# Patient Record
Sex: Male | Born: 1950
Health system: Southern US, Community
[De-identification: ages and names within clinical notes are randomized; demographics above are authoritative.]

## PROBLEM LIST (undated history)

## (undated) DIAGNOSIS — R112 Nausea with vomiting, unspecified: Secondary | ICD-10-CM

## (undated) DIAGNOSIS — T8859XA Other complications of anesthesia, initial encounter: Secondary | ICD-10-CM

## (undated) DIAGNOSIS — Z9889 Other specified postprocedural states: Secondary | ICD-10-CM

## (undated) DIAGNOSIS — N400 Enlarged prostate without lower urinary tract symptoms: Secondary | ICD-10-CM

---

## 2017-08-12 DIAGNOSIS — K640 First degree hemorrhoids: Secondary | ICD-10-CM | POA: Diagnosis not present

## 2017-08-12 DIAGNOSIS — D126 Benign neoplasm of colon, unspecified: Secondary | ICD-10-CM | POA: Diagnosis not present

## 2017-08-12 DIAGNOSIS — Z1211 Encounter for screening for malignant neoplasm of colon: Secondary | ICD-10-CM | POA: Diagnosis not present

## 2017-08-14 DIAGNOSIS — D126 Benign neoplasm of colon, unspecified: Secondary | ICD-10-CM | POA: Diagnosis not present

## 2017-08-14 DIAGNOSIS — Z1211 Encounter for screening for malignant neoplasm of colon: Secondary | ICD-10-CM | POA: Diagnosis not present

## 2017-08-16 DIAGNOSIS — N5201 Erectile dysfunction due to arterial insufficiency: Secondary | ICD-10-CM | POA: Diagnosis not present

## 2017-08-16 DIAGNOSIS — R972 Elevated prostate specific antigen [PSA]: Secondary | ICD-10-CM | POA: Diagnosis not present

## 2017-08-16 DIAGNOSIS — R350 Frequency of micturition: Secondary | ICD-10-CM | POA: Diagnosis not present

## 2017-08-16 DIAGNOSIS — N401 Enlarged prostate with lower urinary tract symptoms: Secondary | ICD-10-CM | POA: Diagnosis not present

## 2017-08-26 DIAGNOSIS — L821 Other seborrheic keratosis: Secondary | ICD-10-CM | POA: Diagnosis not present

## 2017-08-26 DIAGNOSIS — D0472 Carcinoma in situ of skin of left lower limb, including hip: Secondary | ICD-10-CM | POA: Diagnosis not present

## 2017-08-26 DIAGNOSIS — Z1283 Encounter for screening for malignant neoplasm of skin: Secondary | ICD-10-CM | POA: Diagnosis not present

## 2017-09-09 DIAGNOSIS — L03012 Cellulitis of left finger: Secondary | ICD-10-CM | POA: Diagnosis not present

## 2018-08-27 DIAGNOSIS — N5201 Erectile dysfunction due to arterial insufficiency: Secondary | ICD-10-CM | POA: Diagnosis not present

## 2018-08-27 DIAGNOSIS — N401 Enlarged prostate with lower urinary tract symptoms: Secondary | ICD-10-CM | POA: Diagnosis not present

## 2018-08-27 DIAGNOSIS — R3915 Urgency of urination: Secondary | ICD-10-CM | POA: Diagnosis not present

## 2018-08-27 DIAGNOSIS — R972 Elevated prostate specific antigen [PSA]: Secondary | ICD-10-CM | POA: Diagnosis not present

## 2019-03-28 ENCOUNTER — Ambulatory Visit: Payer: Self-pay

## 2019-03-29 ENCOUNTER — Ambulatory Visit: Payer: Self-pay

## 2019-04-03 ENCOUNTER — Ambulatory Visit: Payer: Self-pay

## 2019-04-18 ENCOUNTER — Ambulatory Visit: Payer: Self-pay

## 2019-09-07 DIAGNOSIS — R972 Elevated prostate specific antigen [PSA]: Secondary | ICD-10-CM | POA: Diagnosis not present

## 2019-09-14 DIAGNOSIS — R3912 Poor urinary stream: Secondary | ICD-10-CM | POA: Diagnosis not present

## 2019-09-14 DIAGNOSIS — R972 Elevated prostate specific antigen [PSA]: Secondary | ICD-10-CM | POA: Diagnosis not present

## 2019-09-14 DIAGNOSIS — N401 Enlarged prostate with lower urinary tract symptoms: Secondary | ICD-10-CM | POA: Diagnosis not present

## 2019-09-14 DIAGNOSIS — R3915 Urgency of urination: Secondary | ICD-10-CM | POA: Diagnosis not present

## 2019-12-01 DIAGNOSIS — R972 Elevated prostate specific antigen [PSA]: Secondary | ICD-10-CM | POA: Diagnosis not present

## 2019-12-07 DIAGNOSIS — R3912 Poor urinary stream: Secondary | ICD-10-CM | POA: Diagnosis not present

## 2019-12-07 DIAGNOSIS — N5201 Erectile dysfunction due to arterial insufficiency: Secondary | ICD-10-CM | POA: Diagnosis not present

## 2019-12-07 DIAGNOSIS — R351 Nocturia: Secondary | ICD-10-CM | POA: Diagnosis not present

## 2019-12-07 DIAGNOSIS — N401 Enlarged prostate with lower urinary tract symptoms: Secondary | ICD-10-CM | POA: Diagnosis not present

## 2019-12-07 DIAGNOSIS — R972 Elevated prostate specific antigen [PSA]: Secondary | ICD-10-CM | POA: Diagnosis not present

## 2020-04-25 DIAGNOSIS — N401 Enlarged prostate with lower urinary tract symptoms: Secondary | ICD-10-CM | POA: Diagnosis not present

## 2020-04-25 DIAGNOSIS — R1030 Lower abdominal pain, unspecified: Secondary | ICD-10-CM | POA: Diagnosis not present

## 2020-04-25 DIAGNOSIS — D72829 Elevated white blood cell count, unspecified: Secondary | ICD-10-CM | POA: Diagnosis not present

## 2020-04-25 DIAGNOSIS — R112 Nausea with vomiting, unspecified: Secondary | ICD-10-CM | POA: Diagnosis not present

## 2020-04-25 DIAGNOSIS — R339 Retention of urine, unspecified: Secondary | ICD-10-CM | POA: Diagnosis not present

## 2020-04-27 DIAGNOSIS — R338 Other retention of urine: Secondary | ICD-10-CM | POA: Diagnosis not present

## 2020-05-02 DIAGNOSIS — R338 Other retention of urine: Secondary | ICD-10-CM | POA: Diagnosis not present

## 2020-05-03 ENCOUNTER — Other Ambulatory Visit: Payer: Self-pay

## 2020-05-03 ENCOUNTER — Emergency Department (HOSPITAL_COMMUNITY)
Admission: EM | Admit: 2020-05-03 | Discharge: 2020-05-03 | Disposition: A | Discharge status: Home / Self Care | Payer: Medicare Other | Attending: Emergency Medicine | Admitting: Emergency Medicine

## 2020-05-03 ENCOUNTER — Encounter (HOSPITAL_COMMUNITY): Payer: Self-pay

## 2020-05-03 DIAGNOSIS — R339 Retention of urine, unspecified: Secondary | ICD-10-CM

## 2020-05-03 HISTORY — DX: Benign prostatic hyperplasia without lower urinary tract symptoms: N40.0

## 2020-05-03 NOTE — ED Provider Notes (Signed)
[facility] EMERGENCY DEPARTMENT Provider Note   CSN: [id number] Arrival date & time: 05/03/20  9937     History Chief Complaint  Patient presents with  . Urinary Retention    Alexander Salas is a 70 y.o. male.  HPI Patient presents with urinary retention.  Last week and been in [place] and had urine Rosanne Gutting retention and had a Foley catheter placed.  Sees Dr. Jeffie Pollock for enlarged prostate.  Yesterday went to [facility] urology and saw Fritz Pickerel and had the catheter removed.  States he was able to urinate.  Last urinated around 2 in the morning but only little amount came out.  Now feels that he really has to go and cannot urinate.  Feels like previous retention.  No fevers.  No chills.  Lab work done recently was reassuring.    Past Medical History:  Diagnosis Date  . Enlarged prostate     There are no problems to display for this patient.   History reviewed. No pertinent surgical history.     History reviewed. No pertinent family history.  Social History   Tobacco Use  . Smoking status: Never Smoker  . Smokeless tobacco: Never Used    Home Medications Prior to Admission medications   Not on File    Allergies    Sulfa antibiotics  Review of Systems   Review of Systems  Constitutional: Negative for appetite change and fever.  Respiratory: Negative for shortness of breath.   Cardiovascular: Negative for chest pain.  Gastrointestinal: Positive for abdominal pain.  Genitourinary: Positive for decreased urine volume and difficulty urinating.  Musculoskeletal: Negative for back pain.  Neurological: Negative for weakness.  Psychiatric/Behavioral: Negative for confusion.    Physical Exam Updated Vital Signs BP (!) 152/83 (BP Location: Right Arm)   Pulse 94   Temp 99.9 F (37.7 C) (Oral)   Resp 19   Ht 5\' 11"  (1.803 m)   Wt 79.4 kg   SpO2 100%   BMI 24.41 kg/m   Physical Exam Vitals and nursing note reviewed.  HENT:     Head: Atraumatic.   Eyes:     Pupils: Pupils are equal, round, and reactive to light.  Cardiovascular:     Rate and Rhythm: Regular rhythm.  Abdominal:     Tenderness: There is abdominal tenderness.     Comments: Suprapubic mass and tenderness, likely bladder.  Genitourinary:    Comments: Suprapubic mass. Skin:    General: Skin is warm.     Capillary Refill: Capillary refill takes less than 2 seconds.  Neurological:     Mental Status: He is alert and oriented to person, place, and time.  Psychiatric:        Mood and Affect: Mood normal.     ED Results / Procedures / Treatments   Labs (all labs ordered are listed, but only abnormal results are displayed) Labs Reviewed - No data to display  EKG None  Radiology No results found.  Procedures Procedures   Medications Ordered in ED Medications - No data to display  ED Course  I have reviewed the triage vital signs and the nursing notes.  Pertinent labs & imaging results that were available during my care of the patient were reviewed by me and considered in my medical decision making (see chart for details).    MDM Rules/Calculators/A&P                          Patient  with urinary retention.  History of same.  Had Foley catheter until yesterday.  Feels much better after placement today with large amount of urine return.  Do not think we need further imaging or blood work.  Discharge home with urology follow-up Final Clinical Impression(s) / ED Diagnoses Final diagnoses:  Urinary retention    Rx / DC Orders ED Discharge Orders    None       Davonna Belling, MD 05/03/20 1607

## 2020-05-03 NOTE — ED Triage Notes (Signed)
Pt reports he is here today due to urinary retention. Pt reports he has h/o enlarged prostate. Pt reports he had foley removed yesterday. Since 2am this morning patient has had difficulty time urinating.

## 2020-05-03 NOTE — ED Notes (Signed)
Pt verbalizes understanding of discharge instructions. Opportunity for questions and answers were provided. Pt discharged from the ED.   ?

## 2020-05-03 NOTE — ED Notes (Signed)
Patient denies pain and is resting comfortably.  

## 2020-05-11 DIAGNOSIS — Z87440 Personal history of urinary (tract) infections: Secondary | ICD-10-CM | POA: Diagnosis not present

## 2020-05-11 DIAGNOSIS — R972 Elevated prostate specific antigen [PSA]: Secondary | ICD-10-CM | POA: Diagnosis not present

## 2020-05-11 DIAGNOSIS — N401 Enlarged prostate with lower urinary tract symptoms: Secondary | ICD-10-CM | POA: Diagnosis not present

## 2020-05-11 DIAGNOSIS — R339 Retention of urine, unspecified: Secondary | ICD-10-CM | POA: Diagnosis not present

## 2020-05-17 DIAGNOSIS — N401 Enlarged prostate with lower urinary tract symptoms: Secondary | ICD-10-CM | POA: Diagnosis not present

## 2020-05-17 DIAGNOSIS — R338 Other retention of urine: Secondary | ICD-10-CM | POA: Diagnosis not present

## 2020-05-28 DIAGNOSIS — R972 Elevated prostate specific antigen [PSA]: Secondary | ICD-10-CM | POA: Diagnosis not present

## 2020-05-30 DIAGNOSIS — R972 Elevated prostate specific antigen [PSA]: Secondary | ICD-10-CM | POA: Diagnosis not present

## 2020-06-01 DIAGNOSIS — R972 Elevated prostate specific antigen [PSA]: Secondary | ICD-10-CM | POA: Diagnosis not present

## 2020-06-01 DIAGNOSIS — N401 Enlarged prostate with lower urinary tract symptoms: Secondary | ICD-10-CM | POA: Diagnosis not present

## 2020-06-01 DIAGNOSIS — R339 Retention of urine, unspecified: Secondary | ICD-10-CM | POA: Diagnosis not present

## 2020-06-07 ENCOUNTER — Telehealth: Payer: Self-pay | Admitting: Urology

## 2020-06-07 NOTE — Telephone Encounter (Signed)
-----   Message from Billey Co, MD sent at 06/07/2020  7:55 AM EDT ----- Regarding: HoLEP patient This is a patient referred from [facility] with retention and a foley to discuss HoLEP, please add him to my schedule this week if he is able to make it(Wednesday has some openings), thanks.   Nickolas Madrid, MD 06/07/2020

## 2020-06-07 NOTE — Telephone Encounter (Signed)
Pt appt made tomorrow Wednesday 4/13 at 1:45

## 2020-06-08 ENCOUNTER — Other Ambulatory Visit: Payer: Self-pay

## 2020-06-08 ENCOUNTER — Telehealth: Payer: Self-pay

## 2020-06-08 ENCOUNTER — Encounter: Payer: Self-pay | Admitting: Urology

## 2020-06-08 ENCOUNTER — Ambulatory Visit (INDEPENDENT_AMBULATORY_CARE_PROVIDER_SITE_OTHER): Payer: Medicare Other | Admitting: Urology

## 2020-06-08 VITALS — BP 169/74 | HR 73 | Ht 71.0 in | Wt 175.0 lb

## 2020-06-08 DIAGNOSIS — N401 Enlarged prostate with lower urinary tract symptoms: Secondary | ICD-10-CM | POA: Diagnosis not present

## 2020-06-08 DIAGNOSIS — N138 Other obstructive and reflux uropathy: Secondary | ICD-10-CM | POA: Diagnosis not present

## 2020-06-08 DIAGNOSIS — Z01818 Encounter for other preprocedural examination: Secondary | ICD-10-CM

## 2020-06-08 DIAGNOSIS — N400 Enlarged prostate without lower urinary tract symptoms: Secondary | ICD-10-CM | POA: Diagnosis not present

## 2020-06-08 DIAGNOSIS — R339 Retention of urine, unspecified: Secondary | ICD-10-CM

## 2020-06-08 NOTE — Progress Notes (Signed)
06/08/20 2:08 PM   Angelito Fanny Dance [birth year] [id number]  CC: BPH and urinary retention  HPI: I saw Mr. Cullers in urology clinic today for BPH and urinary retention.  He is a 70 year old healthy male who is followed by Dr. Jeffie Pollock in [place] for [place] and retention.  He first developed urinary retention on February 27 when he was at a conference in [place] and a Foley was placed.  He had over a liter in his bladder at that time.  He initially passed a voiding trial a week or 2 later in clinic in [place], but later that night developed recurrent retention and and return back to the ED and a Foley was placed for bladder scan over a liter.  He has been on Flomax long-term for long history of weak stream and feeling of incomplete emptying.  He also underwent urodynamics recently in [place] that showed a max bladder pressure of 64 cm of water with inability to void PVR greater than 500 mL, and the Foley catheter was replaced.  TRUS showed a prostate volume of 137 g.  He has not undergone cystoscopy.  He denies a family history of prostate cancer, but his brother has a history of BPH and TURP.  He has a history of PSA ranging from 7-14 and has had 2 prior negative biopsies, and PSA elevation most recently at time of acute Foley placement and UTI likely representing a false elevation.  Additionally with his massive 137 g prostate, this likely represents benign enlargement of the prostate.  He currently has a Foley in place at this time draining clear yellow urine.  PMH: Past Medical History:  Diagnosis Date  . Enlarged prostate     Social History:  reports that he has never smoked. He has never used smokeless tobacco. He reports current alcohol use. He reports that he does not use drugs.  Physical Exam: BP (!) 169/74 (BP Location: Left Arm, Patient Position: Sitting, Cuff Size: Large)   Pulse 73   Ht 5\' 11"  (1.803 m)   Wt 175 lb (79.4 kg)   BMI 24.41 kg/m    Constitutional:  Alert and  oriented, No acute distress. Cardiovascular: No clubbing, cyanosis, or edema. Respiratory: Normal respiratory effort, no increased work of breathing. GI: Abdomen is soft, nontender, nondistended, no abdominal masses Foley with clear yellow urine  Laboratory Data: Reviewed, see HPI  Pertinent Imaging: None to review  Assessment & Plan:   70 year old healthy male with a long history of elevated PSA negative prostate biopsies, BPH with weak stream on Flomax, and now recurrent urinary retention with failed voiding trials.  Prostate measures 137 g on TRUS, and urodynamics showed functional detrusor with pressure of 64 cm of water.  We discussed options including chronic Foley, addition of finasteride and consideration of a repeat void trial in the future, intermittent catheterization, or an outlet procedure.  With his prostate over 100 g we discussed that HOLEP would be his best surgical option.  We discussed the risks and benefits of HoLEP at length.  The procedure requires general anesthesia and takes 2 to 3 hours, and a holmium laser is used to enucleate the prostate and push this tissue into the bladder.  A morcellator is then used to remove this tissue, which is sent for pathology.  The vast majority of patients are able to discharge the same day with a catheter in place for 2 to 3 days, and will follow-up in clinic for a voiding trial.  Approximately 5% of  patients will be admitted overnight to monitor the urine, or if they have multiple co-morbidities.  We specifically discussed the risks of bleeding, infection, retrograde ejaculation, temporary urgency and urge incontinence, very low risk of long-term incontinence, pathologic evaluation of prostate tissue and possible detection of prostate cancer or other malignancy, and possible need for additional procedures.  Urinalysis and culture today, call with results Schedule HOLEP next week   Nickolas Madrid, MD 06/08/2020  [facility] [address] [place], [place] [address] [phone]

## 2020-06-08 NOTE — Telephone Encounter (Signed)
Patient called and left a VM on the triage line wanting to know if he should stop taking his finasteride with his upcoming surgery, please advise?

## 2020-06-08 NOTE — H&P (View-Only) (Signed)
06/08/20 2:08 PM   Alexander Salas [birth year] [id number]  CC: BPH and urinary retention  HPI: I saw Alexander Salas in urology clinic today for BPH and urinary retention.  He is a 70 year old healthy male who is followed by Dr. Jeffie Salas in [place] for [place] and retention.  He first developed urinary retention on February 27 when he was at a conference in [place] and a Foley was placed.  He had over a liter in his bladder at that time.  He initially passed a voiding trial a week or 2 later in clinic in [place], but later that night developed recurrent retention and and return back to the ED and a Foley was placed for bladder scan over a liter.  He has been on Flomax long-term for long history of weak stream and feeling of incomplete emptying.  He also underwent urodynamics recently in [place] that showed a max bladder pressure of 64 cm of water with inability to void PVR greater than 500 mL, and the Foley catheter was replaced.  TRUS showed a prostate volume of 137 g.  He has not undergone cystoscopy.  He denies a family history of prostate cancer, but his brother has a history of BPH and TURP.  He has a history of PSA ranging from 7-14 and has had 2 prior negative biopsies, and PSA elevation most recently at time of acute Foley placement and UTI likely representing a false elevation.  Additionally with his massive 137 g prostate, this likely represents benign enlargement of the prostate.  He currently has a Foley in place at this time draining clear yellow urine.  PMH: Past Medical History:  Diagnosis Date  . Enlarged prostate     Social History:  reports that he has never smoked. He has never used smokeless tobacco. He reports current alcohol use. He reports that he does not use drugs.  Physical Exam: BP (!) 169/74 (BP Location: Left Arm, Patient Position: Sitting, Cuff Size: Large)   Pulse 73   Ht 5\' 11"  (1.803 m)   Wt 175 lb (79.4 kg)   BMI 24.41 kg/m    Constitutional:  Alert and  oriented, No acute distress. Cardiovascular: No clubbing, cyanosis, or edema. Respiratory: Normal respiratory effort, no increased work of breathing. GI: Abdomen is soft, nontender, nondistended, no abdominal masses Foley with clear yellow urine  Laboratory Data: Reviewed, see HPI  Pertinent Imaging: None to review  Assessment & Plan:   70 year old healthy male with a long history of elevated PSA negative prostate biopsies, BPH with weak stream on Flomax, and now recurrent urinary retention with failed voiding trials.  Prostate measures 137 g on TRUS, and urodynamics showed functional detrusor with pressure of 64 cm of water.  We discussed options including chronic Foley, addition of finasteride and consideration of a repeat void trial in the future, intermittent catheterization, or an outlet procedure.  With his prostate over 100 g we discussed that HOLEP would be his best surgical option.  We discussed the risks and benefits of HoLEP at length.  The procedure requires general anesthesia and takes 2 to 3 hours, and a holmium laser is used to enucleate the prostate and push this tissue into the bladder.  A morcellator is then used to remove this tissue, which is sent for pathology.  The vast majority of patients are able to discharge the same day with a catheter in place for 2 to 3 days, and will follow-up in clinic for a voiding trial.  Approximately 5% of  patients will be admitted overnight to monitor the urine, or if they have multiple co-morbidities.  We specifically discussed the risks of bleeding, infection, retrograde ejaculation, temporary urgency and urge incontinence, very low risk of long-term incontinence, pathologic evaluation of prostate tissue and possible detection of prostate cancer or other malignancy, and possible need for additional procedures.  Urinalysis and culture today, call with results Schedule HOLEP next week   Alexander Madrid, MD 06/08/2020  [facility] [address], [place], [place] [address] [phone]

## 2020-06-08 NOTE — Telephone Encounter (Signed)
Yes, stop finasteride

## 2020-06-08 NOTE — Telephone Encounter (Signed)
Called pt informed him of the information below. Pt gave verbal understanding. Medication removed from med list.

## 2020-06-08 NOTE — Patient Instructions (Signed)

## 2020-06-09 ENCOUNTER — Other Ambulatory Visit: Payer: Self-pay

## 2020-06-09 ENCOUNTER — Other Ambulatory Visit: Payer: Self-pay | Admitting: Urology

## 2020-06-09 ENCOUNTER — Encounter
Admission: RE | Admit: 2020-06-09 | Discharge: 2020-06-09 | Disposition: A | Discharge status: Home / Self Care | Payer: Medicare Other | Source: Ambulatory Visit | Attending: Urology | Admitting: Urology

## 2020-06-09 DIAGNOSIS — N138 Other obstructive and reflux uropathy: Secondary | ICD-10-CM

## 2020-06-09 DIAGNOSIS — N401 Enlarged prostate with lower urinary tract symptoms: Secondary | ICD-10-CM

## 2020-06-09 HISTORY — DX: Other complications of anesthesia, initial encounter: T88.59XA

## 2020-06-09 HISTORY — DX: Nausea with vomiting, unspecified: R11.2

## 2020-06-09 HISTORY — DX: Other specified postprocedural states: Z98.890

## 2020-06-09 NOTE — Patient Instructions (Addendum)
Your procedure is scheduled on:06-17-20 FRIDAY Report to the Registration Desk on the 1st floor of the [facility]. To find out your arrival time, please call [phone] between 1PM - 3PM on:06-16-20 THURSDAY  REMEMBER: Instructions that are not followed completely may result in serious medical risk, up to and including death; or upon the discretion of your surgeon and anesthesiologist your surgery may need to be rescheduled.  Do not eat food or drink liquids after midnight the night before surgery.  No gum chewing, lozengers or hard candies.  DO NOT TAKE ANY MEDICATION THE MORNING OF SURGERY   One week prior to surgery: Stop Anti-inflammatories (NSAIDS) such as Advil, Aleve, Ibuprofen, Motrin, Naproxen, Naprosyn and Aspirin based products such as Excedrin, Goodys Powder, BC Powder-OK TO TAKE TYLENOL IF NEEDED  Stop ANY OVER THE COUNTER supplements until after surgery-STOP YOUR FISH OIL-D3 NOW-YOU MAY CONTINUE YOUR MULTIVITAMIN UP UNTIL THE DAY PRIOR TO SURGERY  No Alcohol for 24 hours before or after surgery.  No Smoking including e-cigarettes for 24 hours prior to surgery.  No chewable tobacco products for at least 6 hours prior to surgery.  No nicotine patches on the day of surgery.  Do not use any "recreational" drugs for at least a week prior to your surgery.  Please be advised that the combination of cocaine and anesthesia may have negative outcomes, up to and including death. If you test positive for cocaine, your surgery will be cancelled.  On the morning of surgery brush your teeth with toothpaste and water, you may rinse your mouth with mouthwash if you wish. Do not swallow any toothpaste or mouthwash.  Do not wear jewelry, make-up, hairpins, clips or nail polish.  Do not wear lotions, powders, or perfumes.   Do not shave body from the neck down 48 hours prior to surgery just in case you cut yourself which could leave a site for infection.  Also, freshly shaved skin  may become irritated if using the CHG soap.  Contact lenses, hearing aids and dentures may not be worn into surgery.  Do not bring valuables to the hospital. [facility] is not responsible for any missing/lost belongings or valuables.   Notify your doctor if there is any change in your medical condition (cold, fever, infection).  Wear comfortable clothing (specific to your surgery type) to the hospital.  Plan for stool softeners for home use; pain medications have a tendency to cause constipation. You can also help prevent constipation by eating foods high in fiber such as fruits and vegetables and drinking plenty of fluids as your diet allows.  After surgery, you can help prevent lung complications by doing breathing exercises.  Take deep breaths and cough every 1-2 hours. Your doctor may order a device called an Incentive Spirometer to help you take deep breaths. When coughing or sneezing, hold a pillow firmly against your incision with both hands. This is called "splinting." Doing this helps protect your incision. It also decreases belly discomfort.  If you are being admitted to the hospital overnight, leave your suitcase in the car. After surgery it may be brought to your room.  If you are being discharged the day of surgery, you will not be allowed to drive home. You will need a responsible adult (18 years or older) to drive you home and stay with you that night.   If you are taking public transportation, you will need to have a responsible adult (18 years or older) with you. Please confirm with  your physician that it is acceptable to use public transportation.   Please call the [facility] at [phone] if you have any questions about these instructions.  Surgery Visitation Policy:  Patients undergoing a surgery or procedure may have one family member or support person with them as long as that person is not COVID-19 positive or experiencing its symptoms.   That person may remain in the waiting area during the procedure.  Inpatient Visitation:    Visiting hours are 7 a.m. to 8 p.m. Inpatients will be allowed two visitors daily. The visitors may change each day during the patient's stay. No visitors under the age of 67. Any visitor under the age of 56 must be accompanied by an adult. The visitor must pass COVID-19 screenings, use hand sanitizer when entering and exiting the patient's room and wear a mask at all times, including in the patient's room. Patients must also wear a mask when staff or their visitor are in the room. Masking is required regardless of vaccination status.

## 2020-06-10 ENCOUNTER — Encounter
Admission: RE | Admit: 2020-06-10 | Discharge: 2020-06-10 | Disposition: A | Discharge status: Home / Self Care | Payer: Medicare Other | Source: Ambulatory Visit | Attending: Urology | Admitting: Urology

## 2020-06-10 DIAGNOSIS — R54 Age-related physical debility: Secondary | ICD-10-CM | POA: Insufficient documentation

## 2020-06-10 DIAGNOSIS — Z01810 Encounter for preprocedural cardiovascular examination: Secondary | ICD-10-CM | POA: Diagnosis not present

## 2020-06-13 ENCOUNTER — Telehealth: Payer: Self-pay

## 2020-06-13 NOTE — Telephone Encounter (Signed)
Record release faxed to [facility] on 06/08/20 w/ fax confirmation received. Record release scanned to media.

## 2020-06-15 ENCOUNTER — Other Ambulatory Visit
Admission: RE | Admit: 2020-06-15 | Discharge: 2020-06-15 | Disposition: A | Discharge status: Home / Self Care | Payer: Medicare Other | Source: Ambulatory Visit | Attending: Urology | Admitting: Urology

## 2020-06-15 ENCOUNTER — Telehealth: Payer: Self-pay

## 2020-06-15 DIAGNOSIS — A498 Other bacterial infections of unspecified site: Secondary | ICD-10-CM

## 2020-06-15 MED ORDER — CIPROFLOXACIN HCL 500 MG PO TABS: 500.0000 mg | ORAL_TABLET | Freq: Two times a day (BID) | ORAL | 0 refills | Status: DC

## 2020-06-15 NOTE — Telephone Encounter (Signed)
Called pt informed him of the information below. Pt gave verbal understanding. RX sent. Pt voiced understanding.

## 2020-06-15 NOTE — Progress Notes (Signed)
Arrived at covid testing site; had vaccine card in hand (3 doses). Information entered into Epic. Covid test not required prior to surgery.

## 2020-06-15 NOTE — Telephone Encounter (Signed)
-----   Message from Billey Co, MD sent at 06/15/2020  8:29 AM EDT ----- Lets start cipro 500mg  BID x 3 days to sterilize urine before holep this Autumn Messing, MD 06/15/2020

## 2020-06-17 ENCOUNTER — Ambulatory Visit
Admission: RE | Admit: 2020-06-17 | Discharge: 2020-06-17 | Disposition: A | Discharge status: Home / Self Care | Payer: Medicare Other | Attending: Urology | Admitting: Urology

## 2020-06-17 ENCOUNTER — Ambulatory Visit: Payer: Medicare Other | Admitting: Anesthesiology

## 2020-06-17 ENCOUNTER — Ambulatory Visit: Payer: Medicare Other

## 2020-06-17 ENCOUNTER — Encounter: Payer: Self-pay | Admitting: Urology

## 2020-06-17 ENCOUNTER — Encounter
Admission: RE | Disposition: A | Discharge status: Home / Self Care | Payer: Self-pay | Source: Home / Self Care | Attending: Urology

## 2020-06-17 ENCOUNTER — Other Ambulatory Visit: Payer: Self-pay

## 2020-06-17 DIAGNOSIS — N138 Other obstructive and reflux uropathy: Secondary | ICD-10-CM | POA: Insufficient documentation

## 2020-06-17 DIAGNOSIS — R338 Other retention of urine: Secondary | ICD-10-CM | POA: Insufficient documentation

## 2020-06-17 DIAGNOSIS — N401 Enlarged prostate with lower urinary tract symptoms: Secondary | ICD-10-CM | POA: Insufficient documentation

## 2020-06-17 DIAGNOSIS — A498 Other bacterial infections of unspecified site: Secondary | ICD-10-CM

## 2020-06-17 HISTORY — PX: HOLEP-LASER ENUCLEATION OF THE PROSTATE WITH MORCELLATION: SHX6641

## 2020-06-17 LAB — CULTURE, URINE COMPREHENSIVE

## 2020-06-17 SURGERY — ENUCLEATION, PROSTATE, USING LASER, WITH MORCELLATION
Anesthesia: General

## 2020-06-17 MED ORDER — FENTANYL CITRATE (PF) 100 MCG/2ML IJ SOLN
INTRAMUSCULAR | Status: DC | PRN
  Administered 2020-06-17: 25 ug via INTRAVENOUS
  Administered 2020-06-17: 50 ug via INTRAVENOUS
  Administered 2020-06-17: 25 ug via INTRAVENOUS

## 2020-06-17 MED ORDER — PHENYLEPHRINE HCL (PRESSORS) 10 MG/ML IV SOLN
INTRAVENOUS | Status: DC | PRN
  Administered 2020-06-17 (×6): 100 ug via INTRAVENOUS

## 2020-06-17 MED ORDER — ACETAMINOPHEN 10 MG/ML IV SOLN
INTRAVENOUS | Status: AC
  Filled 2020-06-17: qty 100

## 2020-06-17 MED ORDER — ROCURONIUM BROMIDE 100 MG/10ML IV SOLN
INTRAVENOUS | Status: DC | PRN
  Administered 2020-06-17: 30 mg via INTRAVENOUS
  Administered 2020-06-17: 10 mg via INTRAVENOUS
  Administered 2020-06-17: 40 mg via INTRAVENOUS
  Administered 2020-06-17: 10 mg via INTRAVENOUS

## 2020-06-17 MED ORDER — MIDAZOLAM HCL 2 MG/2ML IJ SOLN
INTRAMUSCULAR | Status: AC
  Filled 2020-06-17: qty 2

## 2020-06-17 MED ORDER — DEXAMETHASONE SODIUM PHOSPHATE 10 MG/ML IJ SOLN
INTRAMUSCULAR | Status: DC | PRN
  Administered 2020-06-17: 10 mg via INTRAVENOUS

## 2020-06-17 MED ORDER — CIPROFLOXACIN IN D5W 400 MG/200ML IV SOLN
INTRAVENOUS | Status: AC
  Filled 2020-06-17: qty 200

## 2020-06-17 MED ORDER — FAMOTIDINE 20 MG PO TABS: 20.0000 mg | ORAL_TABLET | Freq: Once | ORAL | Status: AC

## 2020-06-17 MED ORDER — SEVOFLURANE IN SOLN
RESPIRATORY_TRACT | Status: AC
  Filled 2020-06-17: qty 250

## 2020-06-17 MED ORDER — MIDAZOLAM HCL 2 MG/2ML IJ SOLN
INTRAMUSCULAR | Status: DC | PRN
  Administered 2020-06-17: 2 mg via INTRAVENOUS

## 2020-06-17 MED ORDER — SUGAMMADEX SODIUM 200 MG/2ML IV SOLN
INTRAVENOUS | Status: DC | PRN
  Administered 2020-06-17: 200 mg via INTRAVENOUS

## 2020-06-17 MED ORDER — HYDROCODONE-ACETAMINOPHEN 5-325 MG PO TABS: 1.0000 | ORAL_TABLET | Freq: Four times a day (QID) | ORAL | 0 refills | Status: AC | PRN

## 2020-06-17 MED ORDER — ACETAMINOPHEN 10 MG/ML IV SOLN
INTRAVENOUS | Status: DC | PRN
  Administered 2020-06-17: 1000 mg via INTRAVENOUS

## 2020-06-17 MED ORDER — BELLADONNA ALKALOIDS-OPIUM 16.2-60 MG RE SUPP
RECTAL | Status: AC
  Filled 2020-06-17: qty 1

## 2020-06-17 MED ORDER — PROPOFOL 10 MG/ML IV BOLUS
INTRAVENOUS | Status: AC
  Filled 2020-06-17: qty 20

## 2020-06-17 MED ORDER — SUCCINYLCHOLINE CHLORIDE 20 MG/ML IJ SOLN
INTRAMUSCULAR | Status: DC | PRN
  Administered 2020-06-17: 120 mg via INTRAVENOUS

## 2020-06-17 MED ORDER — CEFAZOLIN SODIUM-DEXTROSE 2-4 GM/100ML-% IV SOLN
INTRAVENOUS | Status: AC
  Filled 2020-06-17: qty 100

## 2020-06-17 MED ORDER — LIDOCAINE HCL (CARDIAC) PF 100 MG/5ML IV SOSY
PREFILLED_SYRINGE | INTRAVENOUS | Status: DC | PRN
  Administered 2020-06-17: 100 mg via INTRAVENOUS

## 2020-06-17 MED ORDER — CHLORHEXIDINE GLUCONATE 0.12 % MT SOLN
OROMUCOSAL | Status: AC
  Administered 2020-06-17: 15 mL via OROMUCOSAL
  Filled 2020-06-17: qty 15

## 2020-06-17 MED ORDER — ONDANSETRON HCL 4 MG/2ML IJ SOLN
INTRAMUSCULAR | Status: DC | PRN
  Administered 2020-06-17 (×2): 4 mg via INTRAVENOUS

## 2020-06-17 MED ORDER — CEFAZOLIN SODIUM-DEXTROSE 2-4 GM/100ML-% IV SOLN
2.0000 g | INTRAVENOUS | Status: AC
  Administered 2020-06-17: 2 g via INTRAVENOUS

## 2020-06-17 MED ORDER — PROPOFOL 10 MG/ML IV BOLUS
INTRAVENOUS | Status: DC | PRN
  Administered 2020-06-17: 180 mg via INTRAVENOUS

## 2020-06-17 MED ORDER — FAMOTIDINE 20 MG PO TABS
ORAL_TABLET | ORAL | Status: AC
  Administered 2020-06-17: 20 mg via ORAL
  Filled 2020-06-17: qty 1

## 2020-06-17 MED ORDER — CIPROFLOXACIN HCL 500 MG PO TABS: 500.0000 mg | ORAL_TABLET | Freq: Two times a day (BID) | ORAL | 1 refills | Status: AC

## 2020-06-17 MED ORDER — FENTANYL CITRATE (PF) 100 MCG/2ML IJ SOLN: 25.0000 ug | INTRAMUSCULAR | Status: DC | PRN

## 2020-06-17 MED ORDER — LACTATED RINGERS IV SOLN: INTRAVENOUS | Status: DC

## 2020-06-17 MED ORDER — FENTANYL CITRATE (PF) 100 MCG/2ML IJ SOLN
INTRAMUSCULAR | Status: AC
  Filled 2020-06-17: qty 2

## 2020-06-17 MED ORDER — BELLADONNA ALKALOIDS-OPIUM 16.2-60 MG RE SUPP
RECTAL | Status: DC | PRN
  Administered 2020-06-17: 1 via RECTAL

## 2020-06-17 MED ORDER — EPHEDRINE SULFATE 50 MG/ML IJ SOLN
INTRAMUSCULAR | Status: DC | PRN
  Administered 2020-06-17: 10 mg via INTRAVENOUS

## 2020-06-17 MED ORDER — ONDANSETRON HCL 4 MG/2ML IJ SOLN
4.0000 mg | Freq: Once | INTRAMUSCULAR | Status: DC | PRN
Start: 2020-06-17 — End: 2020-06-17

## 2020-06-17 MED ORDER — CIPROFLOXACIN IN D5W 400 MG/200ML IV SOLN
INTRAVENOUS | Status: DC | PRN
  Administered 2020-06-17: 400 mg via INTRAVENOUS

## 2020-06-17 MED ORDER — ORAL CARE MOUTH RINSE: 15.0000 mL | Freq: Once | OROMUCOSAL | Status: AC

## 2020-06-17 MED ORDER — CHLORHEXIDINE GLUCONATE 0.12 % MT SOLN: 15.0000 mL | Freq: Once | OROMUCOSAL | Status: AC

## 2020-06-17 SURGICAL SUPPLY — 36 items
ADAPTER IRRIG TUBE 2 SPIKE SOL (ADAPTER) ×4 IMPLANT
ADPR TBG 2 SPK PMP STRL ASCP (ADAPTER) ×2
BAG DRN LRG CPC RND TRDRP CNTR (MISCELLANEOUS) ×1
BAG URO DRAIN 4000ML (MISCELLANEOUS) ×2 IMPLANT
CATH FOLEY 3WAY 30CC 24FR (CATHETERS) ×2
CATH URETL 5X70 OPEN END (CATHETERS) ×2 IMPLANT
CATH URTH STD 24FR FL 3W 2 (CATHETERS) ×1 IMPLANT
CONTAINER COLLECT MORCELLATR (MISCELLANEOUS) ×1 IMPLANT
DRAPE UTILITY 15X26 TOWEL STRL (DRAPES) ×2 IMPLANT
ELECT BIVAP BIPO 22/24 DONUT (ELECTROSURGICAL)
ELECTRD BIVAP BIPO 22/24 DONUT (ELECTROSURGICAL) IMPLANT
FIBER LASER MOSES 550 DFL (Laser) ×2 IMPLANT
FILTER OVERFLOW MORCELLATOR (FILTER) ×1 IMPLANT
GLOVE SURG UNDER POLY LF SZ7.5 (GLOVE) ×2 IMPLANT
GOWN STRL REUS W/ TWL LRG LVL3 (GOWN DISPOSABLE) ×1 IMPLANT
GOWN STRL REUS W/ TWL XL LVL3 (GOWN DISPOSABLE) ×1 IMPLANT
GOWN STRL REUS W/TWL LRG LVL3 (GOWN DISPOSABLE) ×2
GOWN STRL REUS W/TWL XL LVL3 (GOWN DISPOSABLE) ×2
HOLDER FOLEY CATH W/STRAP (MISCELLANEOUS) ×2 IMPLANT
IV NS IRRIG 3000ML ARTHROMATIC (IV SOLUTION) ×30 IMPLANT
KIT TURNOVER CYSTO (KITS) ×2 IMPLANT
MANIFOLD NEPTUNE II (INSTRUMENTS) IMPLANT
MBRN O SEALING YLW 17 FOR INST (MISCELLANEOUS) ×2
MEMBRANE SLNG YLW 17 FOR INST (MISCELLANEOUS) ×1 IMPLANT
MORCELLATOR COLLECT CONTAINER (MISCELLANEOUS) ×2
MORCELLATOR OVERFLOW FILTER (FILTER) ×2
MORCELLATOR ROTATION 4.75 335 (MISCELLANEOUS) ×2 IMPLANT
PACK CYSTO AR (MISCELLANEOUS) ×2 IMPLANT
SET CYSTO W/LG BORE CLAMP LF (SET/KITS/TRAYS/PACK) ×2 IMPLANT
SET IRRIG Y TYPE TUR BLADDER L (SET/KITS/TRAYS/PACK) ×2 IMPLANT
SLEEVE PROTECTION STRL DISP (MISCELLANEOUS) ×4 IMPLANT
SURGILUBE 2OZ TUBE FLIPTOP (MISCELLANEOUS) ×2 IMPLANT
SYR TOOMEY IRRIG 70ML (MISCELLANEOUS) ×2
SYRINGE TOOMEY IRRIG 70ML (MISCELLANEOUS) ×1 IMPLANT
TUBE PUMP MORCELLATOR PIRANHA (TUBING) ×2 IMPLANT
WATER STERILE IRR 1000ML POUR (IV SOLUTION) ×2 IMPLANT

## 2020-06-17 NOTE — Anesthesia Postprocedure Evaluation (Signed)
Anesthesia Post Note  Patient: Alexander Salas  Procedure(s) Performed: HOLEP-LASER ENUCLEATION OF THE PROSTATE WITH MORCELLATION (N/A )  Patient location during evaluation: PACU Anesthesia Type: General Level of consciousness: awake and alert and oriented Pain management: pain level controlled Vital Signs Assessment: post-procedure vital signs reviewed and stable Respiratory status: spontaneous breathing, nonlabored ventilation and respiratory function stable Cardiovascular status: blood pressure returned to baseline and stable Postop Assessment: no signs of nausea or vomiting Anesthetic complications: no   No complications documented.   Last Vitals:  Vitals:   06/17/20 1051 06/17/20 1117  BP: 128/68 121/71  Pulse: 69 73  Resp: 18 16  Temp: (!) 36.2 C   SpO2: 99% 99%    Last Pain:  Vitals:   06/17/20 1117  TempSrc:   PainSc: 0-No pain                 Hafsa Lohn

## 2020-06-17 NOTE — OR Nursing (Signed)
Dr. Sninsky in to see pt in postop prior to discharge. 

## 2020-06-17 NOTE — Interval H&P Note (Signed)
UROLOGY H&P UPDATE  Agree with prior H&P dated 06/08/20. BPH and foley dependent urinary retention, prostate measures 137g.  Cardiac: RRR Lungs: CTA bilaterally  Laterality: N/A Procedure: HoLEP  Urine: 4/13 culture pseudomonas, has been on culture appropriate cipro  We discussed the risks and benefits of HoLEP at length.  The procedure requires general anesthesia and takes 2 to 3 hours, and a holmium laser is used to enucleate the prostate and push this tissue into the bladder.  A morcellator is then used to remove this tissue, which is sent for pathology.  The vast majority of patients are able to discharge the same day with a catheter in place for 2 to 3 days, and will follow-up in clinic for a voiding trial.  Approximately 5% of patients will be admitted overnight to monitor the urine, or if they have multiple co-morbidities.  We specifically discussed the risks of bleeding, infection, retrograde ejaculation, temporary urgency and urge incontinence, very low risk of long-term incontinence, pathologic evaluation of prostate tissue and possible detection of prostate cancer or other malignancy, and possible need for additional procedures.   Billey Co, MD 06/17/2020

## 2020-06-17 NOTE — Transfer of Care (Signed)
Immediate Anesthesia Transfer of Care Note  Patient: Alexander Salas  Procedure(s) Performed: HOLEP-LASER ENUCLEATION OF THE PROSTATE WITH MORCELLATION (N/A )  Patient Location: PACU  Anesthesia Type:General  Level of Consciousness: awake and sedated  Airway & Oxygen Therapy: Patient Spontanous Breathing and Patient connected to face mask oxygen  Post-op Assessment: Report given to RN and Post -op Vital signs reviewed and stable  Post vital signs: Reviewed and stable  Last Vitals:  Vitals Value Taken Time  BP    Temp    Pulse 72 06/17/20 0949  Resp 14 06/17/20 0949  SpO2 100 % 06/17/20 0949  Vitals shown include unvalidated device data.  Last Pain:  Vitals:   06/17/20 0354  TempSrc: Temporal  PainSc: 0-No pain      Patients Stated Pain Goal: 0 (65/68/12 7517)  Complications: No complications documented.

## 2020-06-17 NOTE — Anesthesia Preprocedure Evaluation (Addendum)
Anesthesia Evaluation  Patient identified by MRN, date of birth, ID band Patient awake    Reviewed: Allergy & Precautions, NPO status , Patient's Chart, lab work & pertinent test results  History of Anesthesia Complications (+) PONV and history of anesthetic complications  Airway Mallampati: II  TM Distance: >3 FB     Dental  (+) Teeth Intact, Caps   Pulmonary neg pulmonary ROS,    Pulmonary exam normal        Cardiovascular negative cardio ROS Normal cardiovascular exam     Neuro/Psych negative neurological ROS  negative psych ROS   GI/Hepatic negative GI ROS, Neg liver ROS,   Endo/Other  negative endocrine ROS  Renal/GU negative Renal ROS     Musculoskeletal  (+) Arthritis ,   Abdominal Normal abdominal exam  (+)   Peds negative pediatric ROS (+)  Hematology negative hematology ROS (+)   Anesthesia Other Findings Past Medical History: No date: Complication of anesthesia No date: Enlarged prostate No date: PONV (postoperative nausea and vomiting)     Comment:  after 1 hernia surgery  Reproductive/Obstetrics                             Anesthesia Physical Anesthesia Plan  ASA: II  Anesthesia Plan: General   Post-op Pain Management:    Induction: Intravenous  PONV Risk Score and Plan:   Airway Management Planned: Oral ETT  Additional Equipment:   Intra-op Plan:   Post-operative Plan: Extubation in OR  Informed Consent: I have reviewed the patients History and Physical, chart, labs and discussed the procedure including the risks, benefits and alternatives for the proposed anesthesia with the patient or authorized representative who has indicated his/her understanding and acceptance.     Dental advisory given  Plan Discussed with: CRNA and Surgeon  Anesthesia Plan Comments:        Anesthesia Quick Evaluation

## 2020-06-17 NOTE — Discharge Instructions (Addendum)
Indwelling Urinary Catheter Care, Adult An indwelling urinary catheter is a thin tube that is put into your bladder. The tube helps to drain pee (urine) out of your body. The tube goes in through your urethra. Your urethra is where pee comes out of your body. Your pee will come out through the catheter, then it will go into a bag (drainage bag). Take good care of your catheter so it will work well. How to wear your catheter and bag Supplies needed  Sticky tape (adhesive tape) or a leg strap.  Alcohol wipe or soap and water (if you use tape).  A clean towel (if you use tape).  Large overnight bag.  Smaller bag (leg bag). Wearing your catheter Attach your catheter to your leg with tape or a leg strap.  Make sure the catheter is not pulled tight.  If a leg strap gets wet, take it off and put on a dry strap.  If you use tape to hold the bag on your leg: 1. Use an alcohol wipe or soap and water to wash your skin where the tape made it sticky before. 2. Use a clean towel to pat-dry that skin. 3. Use new tape to make the bag stay on your leg. Wearing your bags You should have been given a large overnight bag.  You may wear the overnight bag in the day or night.  Always have the overnight bag lower than your bladder.  Do not let the bag touch the floor.  Before you go to sleep, put a clean plastic bag in a wastebasket. Then hang the overnight bag inside the wastebasket. You should also have a smaller leg bag that fits under your clothes.  Always wear the leg bag below your knee.  Do not wear your leg bag at night. How to care for your skin and catheter Supplies needed  A clean washcloth.  Water and mild soap.  A clean towel. Caring for your skin and catheter  Clean the skin around your catheter every day: 1. Wash your hands with soap and water. 2. Wet a clean washcloth in warm water and mild soap. 3. Clean the skin around your urethra.  If you are male:  Gently  spread the folds of skin around your vagina (labia).  With the washcloth in your other hand, wipe the inner side of your labia on each side. Wipe from front to back.  If you are male:  Pull back any skin that covers the end of your penis (foreskin).  With the washcloth in your other hand, wipe your penis in small circles. Start wiping at the tip of your penis, then move away from the catheter.  Move the foreskin back in place, if needed. 4. With your free hand, hold the catheter close to where it goes into your body.  Keep holding the catheter during cleaning so it does not get pulled out. 5. With the washcloth in your other hand, clean the catheter.  Only wipe downward on the catheter.  Do not wipe upward toward your body. Doing this may push germs into your urethra and cause infection. 6. Use a clean towel to pat-dry the catheter and the skin around it. Make sure to wipe off all soap. 7. Wash your hands with soap and water.  Shower every day. Do not take baths.  Do not use cream, ointment, or lotion on the area where the catheter goes into your body, unless your doctor tells you to.  Do not   use powders, sprays, or lotions on your genital area.  Check your skin around the catheter every day for signs of infection. Check for: ? Redness, swelling, or pain. ? Fluid or blood. ? Warmth. ? Pus or a bad smell.      How to empty the bag Supplies needed  Rubbing alcohol.  Gauze pad or cotton ball.  Tape or a leg strap. Emptying the bag Pour the pee out of your bag when it is ?- full, or at least 2-3 times a day. Do this for your overnight bag and your leg bag. 1. Wash your hands with soap and water. 2. Separate (detach) the bag from your leg. 3. Hold the bag over the toilet or a clean pail. Keep the bag lower than your hips and bladder. This is so the pee (urine) does not go back into the tube. 4. Open the pour spout. It is at the bottom of the bag. 5. Empty the pee into the  toilet or pail. Do not let the pour spout touch any surface. 6. Put rubbing alcohol on a gauze pad or cotton ball. 7. Use the gauze pad or cotton ball to clean the pour spout. 8. Close the pour spout. 9. Attach the bag to your leg with tape or a leg strap. 10. Wash your hands with soap and water. Follow instructions for cleaning the drainage bag:  From the product maker.  As told by your doctor. How to change the bag Supplies needed  Alcohol wipes.  A clean bag.  Tape or a leg strap. Changing the bag Replace your bag when it starts to leak, smell bad, or look dirty. 1. Wash your hands with soap and water. 2. Separate the dirty bag from your leg. 3. Pinch the catheter with your fingers so that pee does not spill out. 4. Separate the catheter tube from the bag tube where these tubes connect (at the connection valve). Do not let the tubes touch any surface. 5. Clean the end of the catheter tube with an alcohol wipe. Use a different alcohol wipe to clean the end of the bag tube. 6. Connect the catheter tube to the tube of the clean bag. 7. Attach the clean bag to your leg with tape or a leg strap. Do not make the bag tight on your leg. 8. Wash your hands with soap and water. General rules  Never pull on your catheter. Never try to take it out. Doing that can hurt you.  Always wash your hands before and after you touch your catheter or bag. Use a mild, fragrance-free soap. If you do not have soap and water, use hand sanitizer.  Always make sure there are no twists or bends (kinks) in the catheter tube.  Always make sure there are no leaks in the catheter or bag.  Drink enough fluid to keep your pee pale yellow.  Do not take baths, swim, or use a hot tub.  If you are male, wipe from front to back after you poop (have a bowel movement).   Contact a doctor if:  Your pee is cloudy.  Your pee smells worse than usual.  Your catheter gets clogged.  Your catheter  leaks.  Your bladder feels full. Get help right away if:  You have redness, swelling, or pain where the catheter goes into your body.  You have fluid, blood, pus, or a bad smell coming from the area where the catheter goes into your body.  Your skin feels   warm where the catheter goes into your body.  You have a fever.  You have pain in your: ? Belly (abdomen). ? Legs. ? Lower back. ? Bladder.  You see blood in the catheter.  Your pee is pink or red.  You feel sick to your stomach (nauseous).  You throw up (vomit).  You have chills.  Your pee is not draining into the bag.  Your catheter gets pulled out. Summary  An indwelling urinary catheter is a thin tube that is placed into the bladder to help drain pee (urine) out of the body.  The catheter is placed into the part of the body that drains pee from the bladder (urethra).  Taking good care of your catheter will keep it working properly and help prevent problems.  Always wash your hands before and after touching your catheter or bag.  Never pull on your catheter or try to take it out. This information is not intended to replace advice given to you by your health care provider. Make sure you discuss any questions you have with your health care provider. Document Revised: 06/06/2018 Document Reviewed: 09/28/2016 Elsevier Patient Education  2021 [employer]   AMBULATORY SURGERY  DISCHARGE INSTRUCTIONS   1) The drugs that you were given will stay in your system until tomorrow so for the next 24 hours you should not:  A) Drive an automobile B) Make any legal decisions C) Drink any alcoholic beverage   2) You may resume regular meals tomorrow.  Today it is better to start with liquids and gradually work up to solid foods.  You may eat anything you prefer, but it is better to start with liquids, then soup and crackers, and gradually work up to solid foods.   3) Please notify your doctor immediately if you  have any unusual bleeding, trouble breathing, redness and pain at the surgery site, drainage, fever, or pain not relieved by medication.    4) Additional Instructions:        Please contact your physician with any problems or Same Day Surgery at [phone], Monday through Friday 6 am to 4 pm, or [facility] Main number at [phone]. 

## 2020-06-17 NOTE — Anesthesia Procedure Notes (Signed)
Procedure Name: Intubation Date/Time: 06/17/2020 7:51 AM Performed by: Nelda Marseille, CRNA Pre-anesthesia Checklist: Patient identified, Patient being monitored, Timeout performed, Emergency Drugs available and Suction available Patient Re-evaluated:Patient Re-evaluated prior to induction Oxygen Delivery Method: Circle system utilized Preoxygenation: Pre-oxygenation with 100% oxygen Induction Type: IV induction Ventilation: Mask ventilation without difficulty Laryngoscope Size: Mac, 3 and McGraph Grade View: Grade I Tube type: Oral Tube size: 7.5 mm Number of attempts: 1 Airway Equipment and Method: Stylet Placement Confirmation: ETT inserted through vocal cords under direct vision,  positive ETCO2 and breath sounds checked- equal and bilateral Secured at: 21 cm Tube secured with: Tape Dental Injury: Teeth and Oropharynx as per pre-operative assessment

## 2020-06-17 NOTE — Progress Notes (Signed)
Weaned off CBI.  Urine is light pink to clear.  Capped inflow per Provider's instructions.  Pt tolerated well.

## 2020-06-17 NOTE — Op Note (Signed)
Date of procedure: 06/17/20  Preoperative diagnosis:  1. BPH and obstruction with urinary retention  Postoperative diagnosis:  1. Same  Procedure: 1. HoLEP (Holmium Laser Enucleation of the Prostate)  Surgeon: Nickolas Madrid, MD  Anesthesia: General  Complications: None  Intraoperative findings:  1.  Large prostate with high bladder neck, significant left lateral lobe hypertrophy 2.  Mild bladder erythema from catheter, no suspicious lesions, ureteral orifices orthotopic bilaterally 3.  Ureteral orifices and verumontanum intact at conclusion of case, good hemostasis  EBL: 10 mL  Specimens: Prostate chips  Enucleation time: 60 minutes  Morcellation time: 23 minutes  Intra-op weight: >100 g  Drains: 24 French three-way, 80 cc in balloon  Indication: Alexander Salas is a 70 y.o. patient with BPH and prostate ~140g on ultrasound and urinary retention who was failed multiple voiding trials.  After reviewing the management options for treatment, they elected to proceed with the above surgical procedure(s). We have discussed the potential benefits and risks of the procedure, side effects of the proposed treatment, the likelihood of the patient achieving the goals of the procedure, and any potential problems that might occur during the procedure or recuperation.  We specifically discussed the risks of bleeding, infection, hematuria and clot retention, need for additional procedures, possible overnight hospital stay, temporary urgency and incontinence, rare long-term incontinence, and retrograde ejaculation.  Informed consent has been obtained.   Description of procedure:  The patient was taken to the operating room and general anesthesia was induced.  The patient was placed in the dorsal lithotomy position, prepped and draped in the usual sterile fashion, and preoperative antibiotics(Cipro and Ancef) were administered.  SCDs were placed for DVT prophylaxis.  A preoperative time-out was  performed.   Alexander Salas sounds were used to gently dilated the urethra up to 5F. The 57 French continuous flow resectoscope was inserted into the urethra using the visual obturator  The prostate was large with a high bladder neck and massive left lateral lobe hypertrophy obstructing the channel. The bladder was thoroughly inspected and notable for mild trabeculations but no suspicious lesions.  There was mild erythema from the indwelling catheter.  The ureteral orifices were located in orthotopic position.  The laser was set to 2 J and 50 Hz and was used to make a lambda incision just proximal to the verumontanum down to the level of the capsule.  A 6 o'clock incision was then made down to the level of the capsule from the bladder neck to the verumontanum.  The lateral lobes were then incised circumferentially until they were disconnected from the surrounding tissue.  The capsule was examined and laser was used for meticulous hemostasis.    The 52 French resectoscope was then switched out for the 52 French nephroscope and the lobes were morcellated and the tissue sent to pathology.  A 24 French three-way catheter was inserted easily, and 80 cc were placed in the balloon.  Urine was light pink.  The catheter irrigated easily with a Toomey syringe.  CBI was initiated. A belladonna suppository was placed.  The patient tolerated the procedure well without any immediate complications and was extubated and transferred to the recovery room in stable condition.  Urine was light pink on fast CBI.  Disposition: Stable to PACU  Plan: Wean CBI in PACU, anticipate discharge home today with void trial in clinic in 2-3 days   Nickolas Madrid, MD 06/17/2020

## 2020-06-20 ENCOUNTER — Encounter: Payer: Self-pay | Admitting: Physician Assistant

## 2020-06-20 ENCOUNTER — Ambulatory Visit (INDEPENDENT_AMBULATORY_CARE_PROVIDER_SITE_OTHER): Payer: Medicare Other | Admitting: Physician Assistant

## 2020-06-20 ENCOUNTER — Other Ambulatory Visit: Payer: Self-pay

## 2020-06-20 ENCOUNTER — Ambulatory Visit: Payer: Medicare Other | Admitting: Physician Assistant

## 2020-06-20 VITALS — BP 153/83 | HR 80 | Ht 71.0 in | Wt 175.0 lb

## 2020-06-20 DIAGNOSIS — N138 Other obstructive and reflux uropathy: Secondary | ICD-10-CM | POA: Diagnosis not present

## 2020-06-20 DIAGNOSIS — N401 Enlarged prostate with lower urinary tract symptoms: Secondary | ICD-10-CM | POA: Diagnosis not present

## 2020-06-20 LAB — SURGICAL PATHOLOGY

## 2020-06-20 LAB — BLADDER SCAN AMB NON-IMAGING

## 2020-06-20 NOTE — Progress Notes (Signed)
Catheter Removal  Patient is present today for a catheter removal.  64ml of water was drained from the balloon. A 24FR three-way foley cath was removed from the bladder no complications were noted . Patient tolerated well.  Performed by: Debroah Loop, PA-C   Follow up/ Additional notes: Push fluids and RTC this afternoon for PVR.

## 2020-06-20 NOTE — Progress Notes (Signed)
Afternoon follow-up  Patient returned to clinic this afternoon for repeat PVR. He reports drinking approximately 30oz of fluid. He has been able to urinate. He has had urinary leakage. PVR 55mL.  Results for orders placed or performed in visit on 06/20/20  BLADDER SCAN AMB NON-IMAGING  Result Value Ref Range   Scan Result 39mL     Voiding trial passed. Counseled patient on normal postoperative findings including dysuria, gross hematuria, and urinary incontinence.  Counseled him to start Kegel exercises 3x10 sets daily.  Shared negative pathology results, patient expressed understanding.  Follow up: 10-week postop follow-up with Dr. Diamantina Providence as already scheduled

## 2020-06-20 NOTE — Patient Instructions (Addendum)
Congratulations on your recent HOLEP procedure! As discussed in clinic today, there are three main side effects that commonly occur after surgery: 1. Burning or pain with urination: This typically resolves within 1 week of surgery. If you are still having significant pain with urination 10 days after surgery, please call our clinic. We may need to check you for a urinary tract infection at that point, though this is rare. 2. Blood in the urine: This may come and go, but typically resolves completely within 3 weeks of surgery. If you are on blood thinners, it may take longer for the bleeding to resolve. As long as your urine remains thin and runny and you are not passing large clots (around the size of your palm), this is a normal postoperative finding. If you start to pass dark red urine or thick, ketchup-like urine, please call our office immediately. 3. Urinary leakage or urgency: This tends to improve with time, with most patients becoming dry within around 3 months of surgery. You may wear absorbant underwear or liners for security during this time. To help you get dry faster, please make sure you are completing your Kegel exercises as instructed, with a set of 10 exercises completed up to three times daily.   Kegel Exercises  Kegel exercises can help strengthen your pelvic floor muscles. The pelvic floor is a group of muscles that support your rectum, small intestine, and bladder. In females, pelvic floor muscles also help support the womb (uterus). These muscles help you control the flow of urine and stool. Kegel exercises are painless and simple, and they do not require any equipment. Your provider may suggest Kegel exercises to:  Improve bladder and bowel control.  Improve sexual response.  Improve weak pelvic floor muscles after surgery to remove the uterus (hysterectomy) or pregnancy (females).  Improve weak pelvic floor muscles after prostate gland removal or surgery (males). Kegel  exercises involve squeezing your pelvic floor muscles, which are the same muscles you squeeze when you try to stop the flow of urine or keep from passing gas. The exercises can be done while sitting, standing, or lying down, but it is best to vary your position. Exercises How to do Kegel exercises: 1. Squeeze your pelvic floor muscles tight. You should feel a tight lift in your rectal area. If you are a male, you should also feel a tightness in your vaginal area. Keep your stomach, buttocks, and legs relaxed. 2. Hold the muscles tight for up to 10 seconds. 3. Breathe normally. 4. Relax your muscles. 5. Repeat as told by your health care provider. Repeat this exercise daily as told by your health care provider. Continue to do this exercise for at least 4-6 weeks, or for as long as told by your health care provider. You may be referred to a physical therapist who can help you learn more about how to do Kegel exercises. Depending on your condition, your health care provider may recommend:  Varying how long you squeeze your muscles.  Doing several sets of exercises every day.  Doing exercises for several weeks.  Making Kegel exercises a part of your regular exercise routine. This information is not intended to replace advice given to you by your health care provider. Make sure you discuss any questions you have with your health care provider. Document Revised: 06/19/2019 Document Reviewed: 10/02/2017 Elsevier Patient Education  2021 [employer]  

## 2020-07-03 ENCOUNTER — Other Ambulatory Visit: Payer: Self-pay

## 2020-07-03 ENCOUNTER — Encounter (HOSPITAL_BASED_OUTPATIENT_CLINIC_OR_DEPARTMENT_OTHER): Payer: Self-pay | Admitting: Emergency Medicine

## 2020-07-03 ENCOUNTER — Emergency Department (HOSPITAL_BASED_OUTPATIENT_CLINIC_OR_DEPARTMENT_OTHER): Payer: Medicare Other

## 2020-07-03 ENCOUNTER — Emergency Department (HOSPITAL_BASED_OUTPATIENT_CLINIC_OR_DEPARTMENT_OTHER)
Admission: EM | Admit: 2020-07-03 | Discharge: 2020-07-03 | Disposition: A | Discharge status: Home / Self Care | Payer: Medicare Other | Attending: Emergency Medicine | Admitting: Emergency Medicine

## 2020-07-03 DIAGNOSIS — D72829 Elevated white blood cell count, unspecified: Secondary | ICD-10-CM | POA: Diagnosis not present

## 2020-07-03 DIAGNOSIS — N50819 Testicular pain, unspecified: Secondary | ICD-10-CM

## 2020-07-03 DIAGNOSIS — N5082 Scrotal pain: Secondary | ICD-10-CM | POA: Diagnosis not present

## 2020-07-03 DIAGNOSIS — N453 Epididymo-orchitis: Secondary | ICD-10-CM | POA: Diagnosis not present

## 2020-07-03 DIAGNOSIS — N451 Epididymitis: Secondary | ICD-10-CM | POA: Diagnosis not present

## 2020-07-03 DIAGNOSIS — N50812 Left testicular pain: Secondary | ICD-10-CM | POA: Diagnosis present

## 2020-07-03 DIAGNOSIS — N452 Orchitis: Secondary | ICD-10-CM | POA: Diagnosis not present

## 2020-07-03 LAB — CBC WITH DIFFERENTIAL/PLATELET
Abs Immature Granulocytes: 0.06 10*3/uL (ref 0.00–0.07)
Basophils Absolute: 0 10*3/uL (ref 0.0–0.1)
Basophils Relative: 0 %
Eosinophils Absolute: 0 10*3/uL (ref 0.0–0.5)
Eosinophils Relative: 0 %
HCT: 42 % (ref 39.0–52.0)
Hemoglobin: 14.3 g/dL (ref 13.0–17.0)
Immature Granulocytes: 0 %
Lymphocytes Relative: 6 %
Lymphs Abs: 1.1 10*3/uL (ref 0.7–4.0)
MCH: 30.6 pg (ref 26.0–34.0)
MCHC: 34 g/dL (ref 30.0–36.0)
MCV: 89.9 fL (ref 80.0–100.0)
Monocytes Absolute: 0.9 10*3/uL (ref 0.1–1.0)
Monocytes Relative: 5 %
Neutro Abs: 15.8 10*3/uL — ABNORMAL HIGH (ref 1.7–7.7)
Neutrophils Relative %: 89 %
Platelets: 250 10*3/uL (ref 150–400)
RBC: 4.67 MIL/uL (ref 4.22–5.81)
RDW: 12.8 % (ref 11.5–15.5)
WBC: 17.8 10*3/uL — ABNORMAL HIGH (ref 4.0–10.5)
nRBC: 0 % (ref 0.0–0.2)

## 2020-07-03 LAB — BASIC METABOLIC PANEL
Anion gap: 9 (ref 5–15)
BUN: 24 mg/dL — ABNORMAL HIGH (ref 8–23)
CO2: 24 mmol/L (ref 22–32)
Calcium: 9 mg/dL (ref 8.9–10.3)
Chloride: 104 mmol/L (ref 98–111)
Creatinine, Ser: 0.94 mg/dL (ref 0.61–1.24)
GFR, Estimated: >60 mL/min (ref >60)
Glucose, Bld: 104 mg/dL — ABNORMAL HIGH (ref 70–99)
Potassium: 4 mmol/L (ref 3.5–5.1)
Sodium: 137 mmol/L (ref 135–145)

## 2020-07-03 LAB — URINALYSIS, ROUTINE W REFLEX MICROSCOPIC
Bilirubin Urine: NEGATIVE
Glucose, UA: NEGATIVE mg/dL
Ketones, ur: NEGATIVE mg/dL
Nitrite: NEGATIVE
Protein, ur: 30 mg/dL — AB
Specific Gravity, Urine: 1.008 (ref 1.005–1.030)
WBC, UA: >50 WBC/hpf — ABNORMAL HIGH (ref 0–5)
pH: 5.5 (ref 5.0–8.0)

## 2020-07-03 MED ORDER — LEVOFLOXACIN 500 MG PO TABS
500.0000 mg | ORAL_TABLET | Freq: Every day | ORAL | 0 refills | Status: DC
Start: 2020-07-03 — End: 2021-01-12

## 2020-07-03 MED ORDER — LEVOFLOXACIN 500 MG PO TABS
500.0000 mg | ORAL_TABLET | Freq: Once | ORAL | Status: AC
  Administered 2020-07-03: 500 mg via ORAL
  Filled 2020-07-03: qty 1

## 2020-07-03 NOTE — ED Provider Notes (Signed)
[facility] Provider Note  CSN: [id number] Arrival date & time: 07/03/20 1733    History Chief Complaint  Patient presents with  . Testicle Pain    HPI  Alexander Salas is a 70 y.o. male with history of BPH recently had laser procedure by urology in [place] to reduce the size of his prostate. He had been doing well post-op without pain or dysuria until this morning he noticed some aching pain in his L testicle that has gotten progressively worse during the day and now also associated with swelling. No fever today. He has noticed a small amount of blood with initiation of urine stream but clears by the end of voiding. He denies any injury to his groin.    Past Medical History:  Diagnosis Date  . Complication of anesthesia   . Enlarged prostate   . PONV (postoperative nausea and vomiting)    after 1 hernia surgery    Past Surgical History:  Procedure Laterality Date  . COLONOSCOPY    . HERNIA REPAIR  [id number], 2015  . HOLEP-LASER ENUCLEATION OF THE PROSTATE WITH MORCELLATION N/A 06/17/2020   Procedure: HOLEP-LASER ENUCLEATION OF THE PROSTATE WITH MORCELLATION;  Surgeon: Billey Co, MD;  Location: [facility] ORS;  Service: Urology;  Laterality: N/A;  . TONSILLECTOMY     age 67    No family history on file.  Social History   Tobacco Use  . Smoking status: Never Smoker  . Smokeless tobacco: Never Used  Vaping Use  . Vaping Use: Never used  Substance Use Topics  . Alcohol use: Yes    Comment: Occasionally   . Drug use: Never     Home Medications Prior to Admission medications   Medication Sig Start Date End Date Taking? Authorizing Provider  Carboxymethylcellul-Glycerin (LUBRICATING EYE DROPS OP) Place 1 drop into both eyes daily.   Yes [provider]  Fish Oil-Cholecalciferol (FISH OIL + D3 PO) Take 4 capsules by mouth daily.   Yes [provider]  ibuprofen (ADVIL) 200 MG tablet Take 800 mg by mouth every 8  (eight) hours as needed for moderate pain.   Yes [provider]  levofloxacin (LEVAQUIN) 500 MG tablet Take 1 tablet (500 mg total) by mouth daily. 07/03/20  Yes Truddie Hidden, MD  Multiple Vitamin (MULTIVITAMIN WITH MINERALS) TABS tablet Take 1 tablet by mouth daily.   Yes [provider]     Allergies    Sulfa antibiotics   Review of Systems   Review of Systems A comprehensive review of systems was completed and negative except as noted in HPI.    Physical Exam BP 133/69   Pulse 77   Temp 98.5 F (36.9 C)   Resp 17   Ht 5\' 11"  (1.803 m)   Wt 79 kg   SpO2 100%   BMI 24.29 kg/m   Physical Exam Vitals and nursing note reviewed.  Constitutional:      Appearance: Normal appearance.  HENT:     Head: Normocephalic and atraumatic.     Nose: Nose normal.     Mouth/Throat:     Mouth: Mucous membranes are moist.  Eyes:     Extraocular Movements: Extraocular movements intact.     Conjunctiva/sclera: Conjunctivae normal.  Cardiovascular:     Rate and Rhythm: Normal rate.  Pulmonary:     Effort: Pulmonary effort is normal.     Breath sounds: Normal breath sounds.  Abdominal:     General: Abdomen is flat.  Palpations: Abdomen is soft.     Tenderness: There is no abdominal tenderness.  Genitourinary:    Comments: R hemiscrotum is normal; L testicle and epididymus is swollen, tender, mild scrotal erythema Musculoskeletal:        General: No swelling. Normal range of motion.     Cervical back: Neck supple.  Skin:    General: Skin is warm and dry.  Neurological:     General: No focal deficit present.     Mental Status: He is alert.  Psychiatric:        Mood and Affect: Mood normal.      ED Results / Procedures / Treatments   Labs (all labs ordered are listed, but only abnormal results are displayed) Labs Reviewed  BASIC METABOLIC PANEL - Abnormal; Notable for the following components:      Result Value   Glucose, Bld 104 (*)    BUN 24 (*)     All other components within normal limits  CBC WITH DIFFERENTIAL/PLATELET - Abnormal; Notable for the following components:   WBC 17.8 (*)    Neutro Abs 15.8 (*)    All other components within normal limits  URINALYSIS, ROUTINE W REFLEX MICROSCOPIC - Abnormal; Notable for the following components:   APPearance HAZY (*)    Hgb urine dipstick LARGE (*)    Protein, ur 30 (*)    Leukocytes,Ua LARGE (*)    WBC, UA >50 (*)    Bacteria, UA FEW (*)    All other components within normal limits  URINE CULTURE    EKG None   Radiology US SCROTUM W/DOPPLER  Result Date: 07/03/2020 CLINICAL DATA:  Left scrotal swelling and pain for 1 day. History of holmium laser enucleation of the prostate (HoLEP) was performed on 06/17/2020. EXAM: SCROTAL ULTRASOUND DOPPLER ULTRASOUND OF THE TESTICLES TECHNIQUE: Complete ultrasound examination of the testicles, epididymis, and other scrotal structures was performed. Color and spectral Doppler ultrasound were also utilized to evaluate blood flow to the testicles. COMPARISON:  None. FINDINGS: Right testicle Measurements: 4.5 x 2.1 x 2.3 cm. A cystic area measuring 0.4 x 0.3 x 0.2 cm and the superior aspect of the testicle likely represents tubular ectasia of the rete testis. No microlithiasis visualized. Left testicle Measurements: 4.4 x 2.2 x 2.8 cm. No mass or microlithiasis visualized. The overlying scrotal skin is thickened, measuring 0.7 cm. Right epididymis:  Normal in size and appearance. Left epididymis:  Hyperemic and questionably enlarged. Hydrocele:  None visualized. Varicocele:  None visualized. Pulsed Doppler interrogation of both testes demonstrates normal low resistance arterial and venous waveforms bilaterally, however the left testicle is hyperemic relative to the right. IMPRESSION: Hyperemia of the left testicle and epididymis may represent epididymo-orchitis. Electronically Signed   By: Zerita Boers M.D.   On: 07/03/2020 19:07     Procedures Procedures  Medications Ordered in the ED Medications  levofloxacin (LEVAQUIN) tablet 500 mg (500 mg Oral Given 07/03/20 1955)     MDM Rules/Calculators/A&P MDM Patient with testicular swelling/pain today. Will check CBC, BMP, UA and send for [place]. Patient declines pain medications at this time.  ED Course  I have reviewed the triage vital signs and the nursing notes.  Pertinent labs & imaging results that were available during my care of the patient were reviewed by me and considered in my medical decision making (see chart for details).  Clinical Course as of 07/03/20 2112  Nancy Fetter Jul 03, 2020  1928 CBC with leukocytosis, [place] consistent with epididymo-orchitis. Plan to  start Abx for same.  [CS]  1931 UA with signs of infection, sent for culture.  [CS]  1943 BMP is normal.  [CS]  1951 Levaquin given in the ED, Rx for same, Urology follow up.  [CS]    Clinical Course User Index [CS] Truddie Hidden, MD    Final Clinical Impression(s) / ED Diagnoses Final diagnoses:  Epididymo-orchitis    Rx / DC Orders ED Discharge Orders         Ordered    levofloxacin (LEVAQUIN) 500 MG tablet  Daily        07/03/20 1951           Truddie Hidden, MD 07/03/20 2112

## 2020-07-03 NOTE — ED Notes (Signed)
Pt verbalizes understanding of discharge instructions. Opportunity for questioning and answers were provided. Armand removed by staff, pt discharged from ED to home. Educated on abx rx.

## 2020-07-03 NOTE — ED Triage Notes (Signed)
Pt reports surgery 2 weeks ago to reduce size of prostate. Pt reports healing with no problems until this am when he awoke with left testicles swollen and tender. Reports occasional hematuria ,but that is expected from his surgery.

## 2020-07-06 LAB — URINE CULTURE: Culture: >=100000 — AB

## 2020-07-07 ENCOUNTER — Telehealth: Payer: Self-pay | Admitting: *Deleted

## 2020-07-07 NOTE — Telephone Encounter (Signed)
Post ED Visit - Positive Culture Follow-up  Culture report reviewed by antimicrobial stewardship pharmacist: [place] Team []  Elenor Quinones, Pharm.D. []  Heide Guile, Pharm.D., BCPS AQ-ID []  Parks Neptune, Pharm.D., BCPS []  Alycia Rossetti, Pharm.D., BCPS []  [place], Pharm.D., BCPS, AAHIVP []  Legrand Como, Pharm.D., BCPS, AAHIVP []  Salome Arnt, PharmD, BCPS []  Johnnette Gourd, PharmD, BCPS []  Hughes Better, PharmD, BCPS []  Leeroy Cha, PharmD []  Laqueta Linden, PharmD, BCPS []  Albertina Parr, PharmD  [place] Team []  Leodis Sias, PharmD []  Lindell Spar, PharmD []  Royetta Asal, PharmD []  Graylin Shiver, Rph []  Rema Fendt) Glennon Mac, PharmD []  Arlyn Dunning, PharmD []  Netta Cedars, PharmD []  Dia Sitter, PharmD []  Leone Haven, PharmD []  Gretta Arab, PharmD []  Theodis Shove, PharmD []  Peggyann Juba, PharmD []  Reuel Boom, PharmD   Positive urine culture Treated with Levofloxacin, organism sensitive to the same and no further patient follow-up is required at this time.  Harlon Flor [facility] 07/07/2020, 11:04 AM

## 2020-07-14 ENCOUNTER — Encounter: Payer: Self-pay | Admitting: Urology

## 2020-07-14 ENCOUNTER — Other Ambulatory Visit: Payer: Self-pay

## 2020-07-14 ENCOUNTER — Ambulatory Visit (INDEPENDENT_AMBULATORY_CARE_PROVIDER_SITE_OTHER): Payer: Medicare Other | Admitting: Urology

## 2020-07-14 VITALS — BP 153/81 | HR 68 | Ht 71.0 in | Wt 172.0 lb

## 2020-07-14 DIAGNOSIS — N453 Epididymo-orchitis: Secondary | ICD-10-CM

## 2020-07-14 DIAGNOSIS — N401 Enlarged prostate with lower urinary tract symptoms: Secondary | ICD-10-CM

## 2020-07-14 DIAGNOSIS — N138 Other obstructive and reflux uropathy: Secondary | ICD-10-CM

## 2020-07-14 DIAGNOSIS — N400 Enlarged prostate without lower urinary tract symptoms: Secondary | ICD-10-CM

## 2020-07-14 LAB — BLADDER SCAN AMB NON-IMAGING

## 2020-07-14 MED ORDER — LEVOFLOXACIN 500 MG PO TABS: 500.0000 mg | ORAL_TABLET | Freq: Every day | ORAL | 0 refills | Status: DC

## 2020-07-14 NOTE — Patient Instructions (Signed)
Orchitis  Orchitis is inflammation of a testicle. Testicles are the male organs that produce sperm. The testicles are held in a fleshy sac (scrotum) located behind the penis. Orchitis usually affects only one testicle, but it can affect both. Orchitis is caused by infection. Many kinds of bacteria and viruses can cause this infection. The condition can develop suddenly. What are the causes? This condition may be caused by:  Infection from viruses or bacteria.  Other organisms, such as fungi or parasites (rare). This is common in men who have a weak body defense system (immune system), such as men with HIV. Bacteria   Bacterial orchitis often occurs along with an infection of the tube that collects and stores sperm (epididymis).  In men who are not sexually active, this infection usually starts as a urinary tract infection and spreads to the testicle.  In sexually active men, sexually transmitted infections (STIs) are the most common cause of bacterial orchitis. These can include: ? Gonorrhea. ? Chlamydia. Viruses  Mumps is the most common cause of viral orchitis, though mumps is now rare in many areas because of vaccination.  Other viruses that can cause orchitis include: ? The chickenpox virus (varicella-zoster virus). ? The virus that causes mononucleosis (Epstein-Barr virus). What increases the risk? The following factors may make you more likely to develop this condition:  For viral orchitis: ? Not having been vaccinated against mumps.  For bacterial orchitis: ? Having had frequent urinary tract infections. ? Engaging in high-risk sexual behaviors, such as having multiple sexual partners or having sex without using a condom. ? Having a sexual partner with an STI. ? Having had urinary tract surgery. ? Using a tube that is passed through the penis to drain urine (Foley catheter). ? Having an enlarged prostate gland. What are the signs or symptoms? The most common symptoms of  orchitis are swelling and pain in the scrotum. Other signs and symptoms may include:  Feeling generally sick (malaise).  Fever and chills.  Painful urination.  Painful ejaculation.  Headache.  Fatigue.  Nausea.  Blood or discharge from the penis.  Swollen lymph nodes in the groin area (inguinal nodes). How is this diagnosed? This condition may be diagnosed based on:  Your symptoms. Your health care provider may suspect orchitis if you have a painful, swollen testicle along with other signs and symptoms of the condition.  A physical exam. You may also have other tests, including:  A blood test to check for signs of infection.  A urine test to check for a urinary tract infection or STI.  Using a swab to collect a fluid sample from the tip of the penis to test for STIs.  Taking an image of the testicle using sound waves and a computer (testicular ultrasound). How is this treated? Treatment for this condition depends on the cause.  For bacterial orchitis, your health care provider may prescribe antibiotic medicines. Bacterial infections usually clear up within a few days. For both viral infections and bacterial infections, you may be treated with:  Rest.  Anti-inflammatory medicines.  Pain medicines.  Raising (elevating) the scrotum with a towel or pillow underneath and applying ice. Follow these instructions at home:  Rest as directed by your health care provider.  Take over-the-counter and prescription medicines only as told by your health care provider.  If you were prescribed an antibiotic medicine, take it as told by your health care provider. Do not stop taking the antibiotic even if you start to feel better.  Do not have sex until your health care provider says it is okay to do so.  Elevate your scrotum and apply ice as directed: ? Put ice in a plastic bag. ? Place a small towel or pillow between your legs. ? Rest your scrotum on the pillow or  towel. ? Place another towel between your skin and the plastic bag. ? Leave the ice on for 20 minutes, 2-3 times a day.  Keep all follow-up visits as told by your health care provider. This is important. Contact a health care provider if:  You have a fever.  Pain and swelling have not gotten better after 3 days. Get help right away if:  Your pain is getting worse.  The swelling in your testicle gets worse. Summary  Orchitis is inflammation of a testicle. It is caused by an infection from bacteria or a virus.  The most common symptoms of orchitis are swelling and pain in the scrotum.  Treatment for this condition depends on the cause. It may include medicines to fight the infection, reduce inflammation, and relieve the pain.  Follow your health care provider's instructions about resting, icing, not having sex, and taking medicines. This information is not intended to replace advice given to you by your health care provider. Make sure you discuss any questions you have with your health care provider. Document Revised: 03/01/2017 Document Reviewed: 03/01/2017 Elsevier Patient Education  2021 [employer].

## 2020-07-14 NOTE — Progress Notes (Signed)
   07/14/2020 2:56 PM   Alexander Salas Alexander Salas Nov 11, 1950 [id number]  Reason for visit: Epididymoorchitis after HOLEP  HPI: 70 year old male with Foley dependent urinary retention who underwent an uncomplicated HOLEP on 1/66/0630 with removal of 125 g of benign prostate tissue.  He has been urinating spontaneously with no incontinence and doing well from a urinary perspective.  He presented to the ED on 07/03/2020 with acute onset of left testicular pain, and was diagnosed with left epididymoorchitis based on scrotal ultrasound and infected appearing urine.  Urine culture ultimately grew Pseudomonas and he was treated with culture appropriate Levaquin x7 days with improvement in his symptoms.  He still has some left-sided soreness.  On exam he still has some significant edema and induration of the left testicle.  I recommended a an additional 10-day course of Levaquin with his persistent mild symptoms and significant edema, as well as NSAIDs, snug fitting underwear, icing as needed.  Information provided and discussed long course of 4 to 6 weeks before complete resolution of left-sided swelling and discomfort.  RTC 6 months with PSA prior He would like to continue yearly follow-up with [place] regarding ED and PSA screening   Billey Co, [place] [address] [place], [place] [address] [phone]

## 2020-07-14 NOTE — Addendum Note (Signed)
Addended by: Despina Hidden on: 07/14/2020 03:59 PM   Modules accepted: Orders

## 2020-08-01 DIAGNOSIS — H9313 Tinnitus, bilateral: Secondary | ICD-10-CM | POA: Diagnosis not present

## 2020-08-01 DIAGNOSIS — Z77122 Contact with and (suspected) exposure to noise: Secondary | ICD-10-CM | POA: Diagnosis not present

## 2020-08-01 DIAGNOSIS — H903 Sensorineural hearing loss, bilateral: Secondary | ICD-10-CM | POA: Diagnosis not present

## 2020-08-04 DIAGNOSIS — H40013 Open angle with borderline findings, low risk, bilateral: Secondary | ICD-10-CM | POA: Diagnosis not present

## 2020-08-24 ENCOUNTER — Ambulatory Visit: Payer: Medicare Other | Admitting: Urology

## 2020-09-15 DIAGNOSIS — H16142 Punctate keratitis, left eye: Secondary | ICD-10-CM | POA: Diagnosis not present

## 2020-09-21 DIAGNOSIS — D2272 Melanocytic nevi of left lower limb, including hip: Secondary | ICD-10-CM | POA: Diagnosis not present

## 2020-09-21 DIAGNOSIS — L821 Other seborrheic keratosis: Secondary | ICD-10-CM | POA: Diagnosis not present

## 2020-09-21 DIAGNOSIS — D225 Melanocytic nevi of trunk: Secondary | ICD-10-CM | POA: Diagnosis not present

## 2020-10-05 DIAGNOSIS — U071 COVID-19: Secondary | ICD-10-CM | POA: Diagnosis not present

## 2020-10-05 DIAGNOSIS — J029 Acute pharyngitis, unspecified: Secondary | ICD-10-CM | POA: Diagnosis not present

## 2020-10-13 DIAGNOSIS — Z20822 Contact with and (suspected) exposure to covid-19: Secondary | ICD-10-CM | POA: Diagnosis not present

## 2021-01-05 ENCOUNTER — Other Ambulatory Visit: Payer: Self-pay

## 2021-01-05 ENCOUNTER — Other Ambulatory Visit: Payer: Medicare Other

## 2021-01-05 DIAGNOSIS — N400 Enlarged prostate without lower urinary tract symptoms: Secondary | ICD-10-CM

## 2021-01-06 LAB — PSA: Prostate Specific Ag, Serum: 3.5 ng/mL (ref 0.0–4.0)

## 2021-01-12 ENCOUNTER — Ambulatory Visit (INDEPENDENT_AMBULATORY_CARE_PROVIDER_SITE_OTHER): Payer: Medicare Other | Admitting: Urology

## 2021-01-12 ENCOUNTER — Other Ambulatory Visit: Payer: Self-pay

## 2021-01-12 ENCOUNTER — Encounter: Payer: Self-pay | Admitting: Urology

## 2021-01-12 VITALS — BP 164/90 | HR 71 | Ht 71.0 in | Wt 175.0 lb

## 2021-01-12 DIAGNOSIS — N401 Enlarged prostate with lower urinary tract symptoms: Secondary | ICD-10-CM | POA: Diagnosis not present

## 2021-01-12 DIAGNOSIS — N138 Other obstructive and reflux uropathy: Secondary | ICD-10-CM | POA: Diagnosis not present

## 2021-01-12 DIAGNOSIS — N529 Male erectile dysfunction, unspecified: Secondary | ICD-10-CM

## 2021-01-12 DIAGNOSIS — Z125 Encounter for screening for malignant neoplasm of prostate: Secondary | ICD-10-CM | POA: Diagnosis not present

## 2021-01-12 MED ORDER — TADALAFIL 5 MG PO TABS: 5.0000 mg | ORAL_TABLET | Freq: Every day | ORAL | 3 refills | Status: DC | PRN

## 2021-01-12 NOTE — Patient Instructions (Signed)
Tadalafil Tablets (Erectile Dysfunction, BPH) What is this medication? TADALAFIL (tah DA la fil) treats erectile dysfunction (ED). It works by increasing blood flow to the penis, which helps to maintain an erection. It may also be used to treat symptoms of an enlarged prostate (benign prostatic hyperplasia). This medicine may be used for other purposes; ask your health care provider or pharmacist if you have questions. COMMON BRAND NAME(S): Kathaleen Bury, Cialis What should I tell my care team before I take this medication? They need to know if you have any of these conditions: Anatomical deformation of the penis, Peyronie's disease, or history of priapism (painful and prolonged erection) Bleeding disorders Eye or vision problems, including a rare inherited eye disease called retinitis pigmentosa Heart disease, angina, a history of heart attack, irregular heart beats, or other heart problems High or low blood pressure History of blood diseases, like sickle cell anemia or leukemia History of stomach bleeding Kidney disease Liver disease Stroke An unusual or allergic reaction to tadalafil, other medications, foods, dyes, or preservatives Pregnant or trying to get pregnant Breast-feeding How should I use this medication? Take this medication by mouth with a glass of water. Follow the directions on the prescription label. You may take this medication with or without meals. When this medication is used for erection problems, your care team may prescribe it to be taken once daily or as needed. If you are taking the medication as needed, you may be able to have sexual activity 30 minutes after taking it and for up to 36 hours after taking it. Whether you are taking the medication as needed or once daily, you should not take more than one dose per day. If you are taking this medication for symptoms of benign prostatic hyperplasia (BPH) or to treat both BPH and an erection problem, take the dose once  daily at about the same time each day. Do not take your medication more often than directed. Talk to your care team about the use of this medication in children. Special care may be needed. Overdosage: If you think you have taken too much of this medicine contact a poison control center or emergency room at once. NOTE: This medicine is only for you. Do not share this medicine with others. What if I miss a dose? If you are taking this medication as needed for erection problems, this does not apply. If you miss a dose while taking this medication once daily for an erection problem, benign prostatic hyperplasia, or both, take it as soon as you remember, but do not take more than one dose per day. What may interact with this medication? Do not take this medication with any of the following: Nitrates like amyl nitrite, isosorbide dinitrate, isosorbide mononitrate, nitroglycerin Other medications for erectile dysfunction like avanafil, sildenafil, vardenafil Other tadalafil products (Adcirca) Riociguat This medication may also interact with the following: Certain medications for high blood pressure Certain medications for the treatment of HIV infection or AIDS Certain medications used for fungal or yeast infections, like fluconazole, itraconazole, ketoconazole, and voriconazole Certain medications used for seizures like carbamazepine, phenytoin, and phenobarbital Grapefruit juice Macrolide antibiotics like clarithromycin, erythromycin, troleandomycin Medications for prostate problems Rifabutin, rifampin or rifapentine This list may not describe all possible interactions. Give your health care provider a list of all the medicines, herbs, non-prescription drugs, or dietary supplements you use. Also tell them if you smoke, drink alcohol, or use illegal drugs. Some items may interact with your medicine. What should I watch for  while using this medication? If you notice any changes in your vision while  taking this medication, call your care team as soon as possible. Stop using this medication and call your care team right away if you have a loss of sight in one or both eyes. Contact your care team right away if the erection lasts longer than 4 hours or if it becomes painful. This may be a sign of serious problem and must be treated right away to prevent permanent damage. If you experience symptoms of nausea, dizziness, chest pain or arm pain upon initiation of sexual activity after taking this medication, you should refrain from further activity and call your care team as soon as possible. Do not drink alcohol to excess (examples, 5 glasses of wine or 5 shots of whiskey) when taking this medication. When taken in excess, alcohol can increase your chances of getting a headache or getting dizzy, increasing your heart rate or lowering your blood pressure. Using this medication does not protect you or your partner against HIV infection (the virus that causes AIDS) or other sexually transmitted diseases. What side effects may I notice from receiving this medication? Side effects that you should report to your care team as soon as possible: Allergic reactions--skin rash, itching, hives, swelling of the face, lips, tongue, or throat Hearing loss or ringing in ears Heart attack--pain or tightness in the chest, shoulders, arms, or jaw, nausea, shortness of breath, cold or clammy skin, feeling faint or lightheaded Low blood pressure--dizziness, feeling faint or lightheaded, blurry vision Prolonged or painful erection Redness, blistering, peeling, or loosening of the skin, including inside the mouth Stroke--sudden numbness or weakness of the face, arm, or leg, trouble speaking, confusion, trouble walking, loss of balance or coordination, dizziness, severe headache, change in vision Sudden vision loss in one or both eyes Side effects that usually do not require medical attention (report to your care team if  they continue or are bothersome): Back pain Facial flushing or redness Headache Muscle pain Runny or stuffy nose Upset stomach This list may not describe all possible side effects. Call your doctor for medical advice about side effects. You may report side effects to FDA at 1-800-FDA-1088. Where should I keep my medication? Keep out of the reach of children. Store at room temperature between 15 and 30 degrees C (59 and 86 degrees F). Throw away any unused medication after the expiration date. NOTE: This sheet is a summary. It may not cover all possible information. If you have questions about this medicine, talk to your doctor, pharmacist, or health care provider.  2022 Elsevier/Gold Standard (2020-04-28 00:00:00)

## 2021-01-12 NOTE — Progress Notes (Signed)
   01/12/2021 1:11 PM   Kofi APOLIDCVU 04/13/1950 [id number]  Reason for visit: Follow up BPH status post HOLEP, ED, PSA screening  HPI: 70 year old male who underwent a HOLEP in April 2022 for Foley dependent urinary retention with removal of 125 g of benign tissue.  He ended up with epididymoorchitis about 3 weeks after surgery that resolved with Levaquin.  He continues to urinate with a strong stream and denies any incontinence.  He has a history of significantly elevated PSA ranging from 7-14 preoperatively, and also underwent 2 negative biopsies prior to HOLEP.  Repeat PSA in November 2022 had normalized to 3.5, which may be slightly higher than expected based on his epididymoorchitis postop.  With his excellent health he would like to continue PSA screening at least 1 more year which I think is reasonable.  He has erectile dysfunction that has been well controlled with 40 to 60 mg of sildenafil on demand, however he gets significant facial flushing that is uncomfortable.  We discussed alternative of Cialis 5 to 20 mg on demand, and discussed the advantages of prolonged half-life, and he is interested in a trial of the Cialis.  Trial of Cialis 5 to 20 mg as needed for ED RTC 1 year PSA prior   Billey Co, MD  [facility] [address] [place], [place] [address] [phone]

## 2021-02-02 DIAGNOSIS — H40023 Open angle with borderline findings, high risk, bilateral: Secondary | ICD-10-CM | POA: Diagnosis not present

## 2021-07-08 IMAGING — US US SCROTUM W/ DOPPLER COMPLETE
1 series · 13 of 25 positions shown · non-contrast
Comparison: None.

CLINICAL DATA: Left scrotal swelling and pain for 1 day. History of
holmium laser enucleation of the prostate (HoLEP) was performed on
06/17/2020.

EXAM:
SCROTAL ULTRASOUND
DOPPLER ULTRASOUND OF THE TESTICLES
TECHNIQUE: Complete ultrasound examination of the testicles, epididymis, and
other scrotal structures was performed. Color and spectral Doppler
ultrasound were also utilized to evaluate blood flow to the
testicles.

[Series 1: us scrotum w/doppler · 128 acquisitions, 13 frames shown]
[im 1/128]
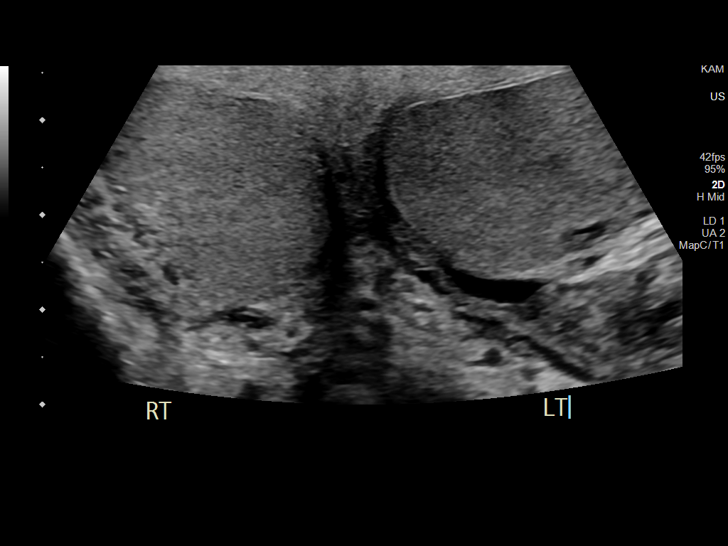
[im 11/128]
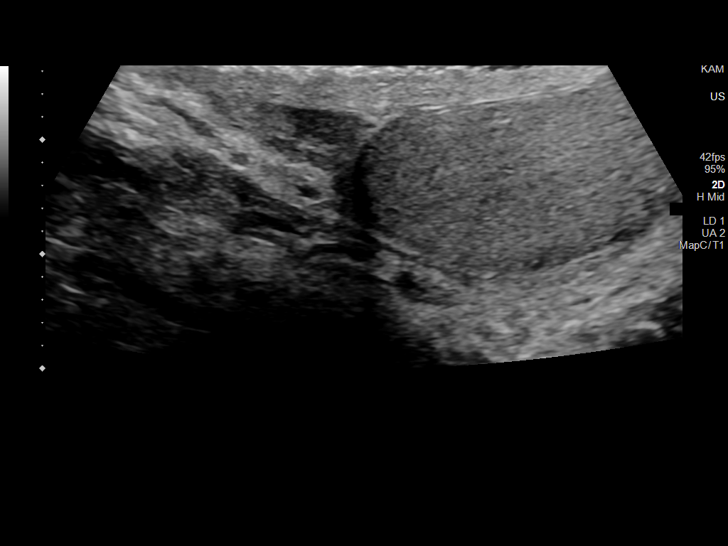
[im 22/128]
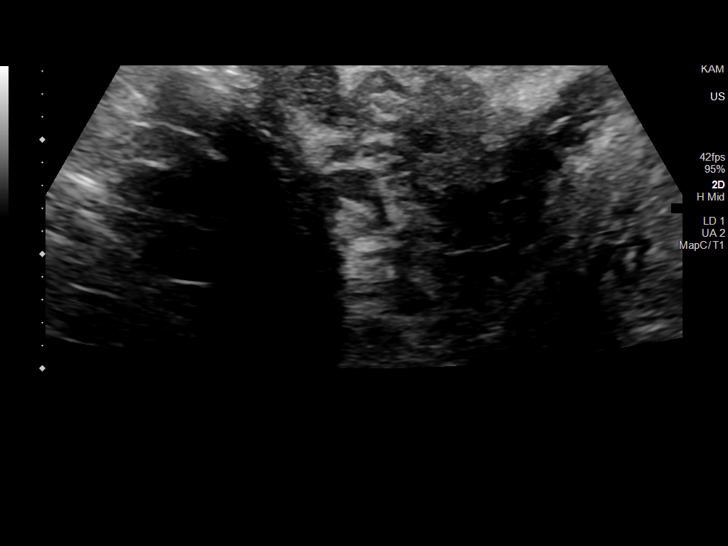
[im 32/128]
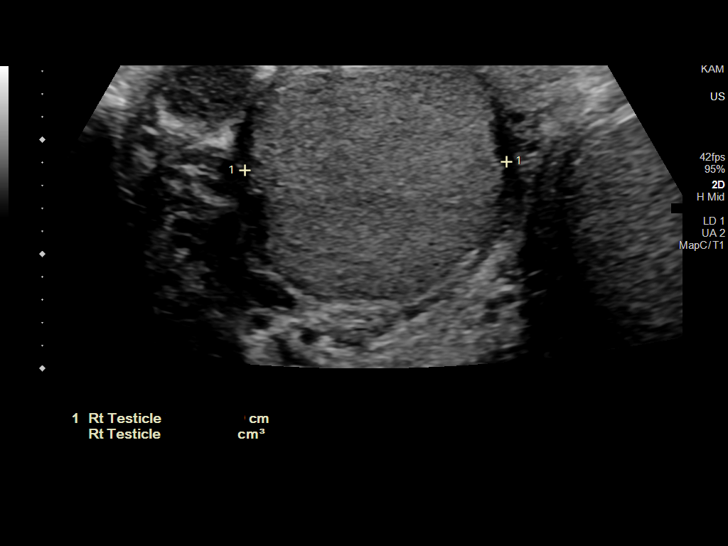
[im 43/128]
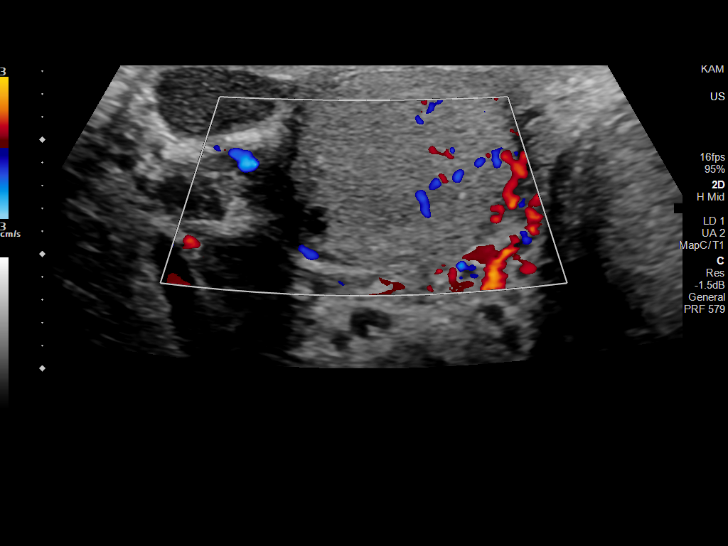
[im 53/128]
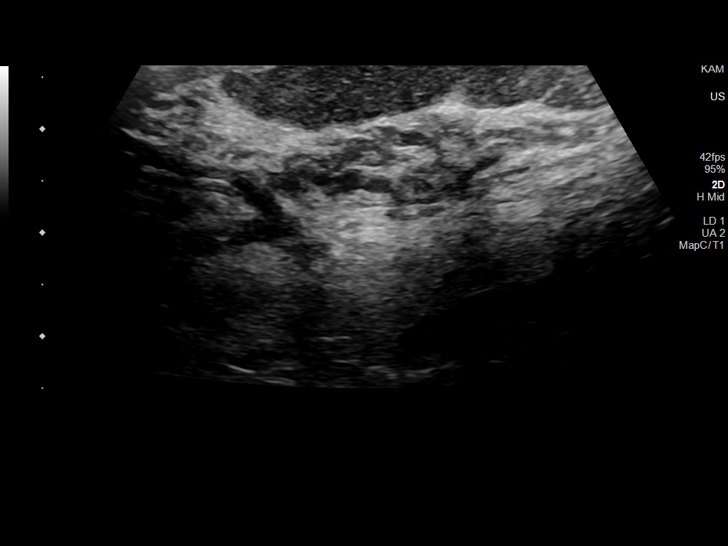
[im 64/128]
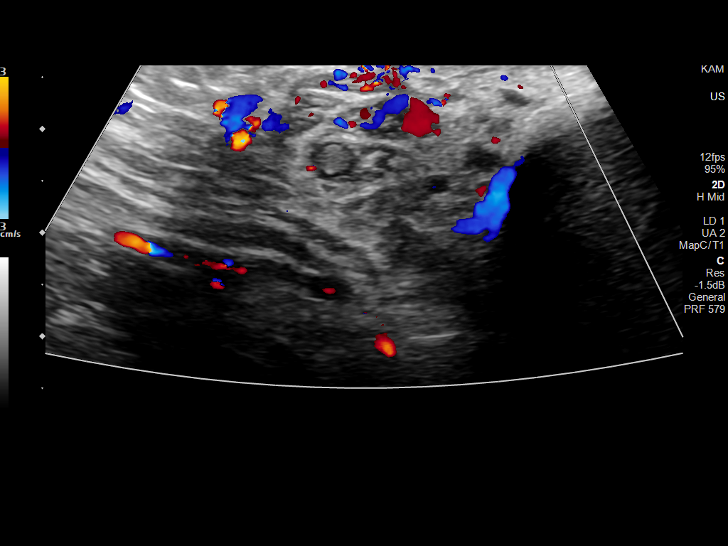
[im 75/128]
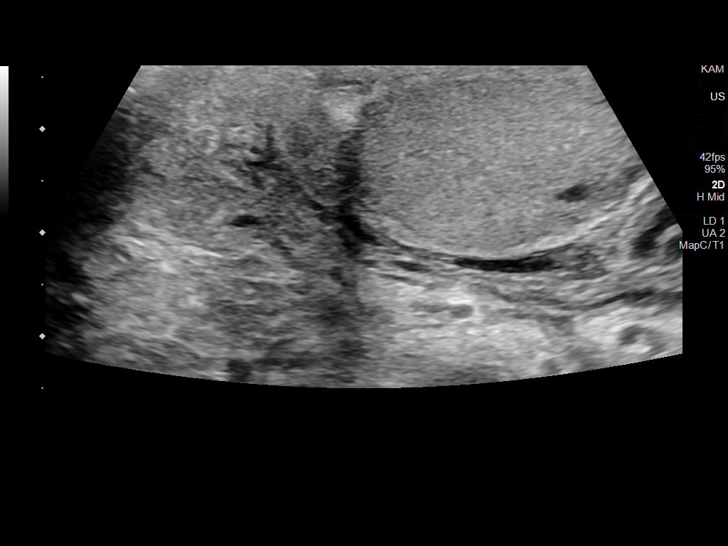
[im 85/128]
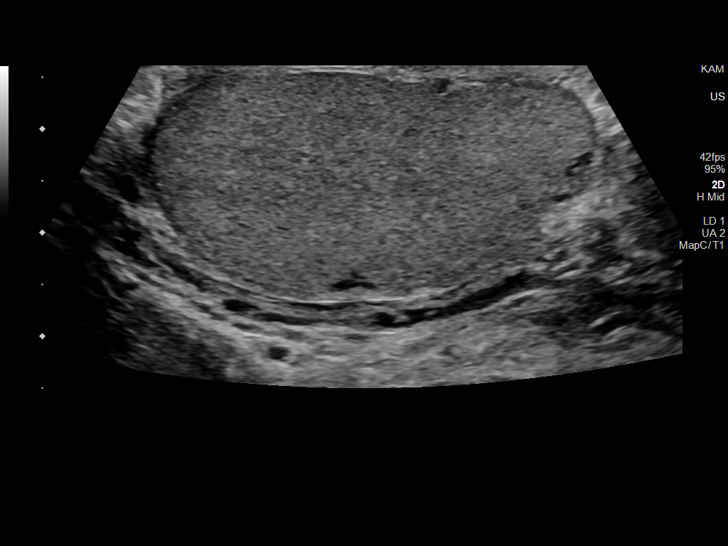
[im 96/128]
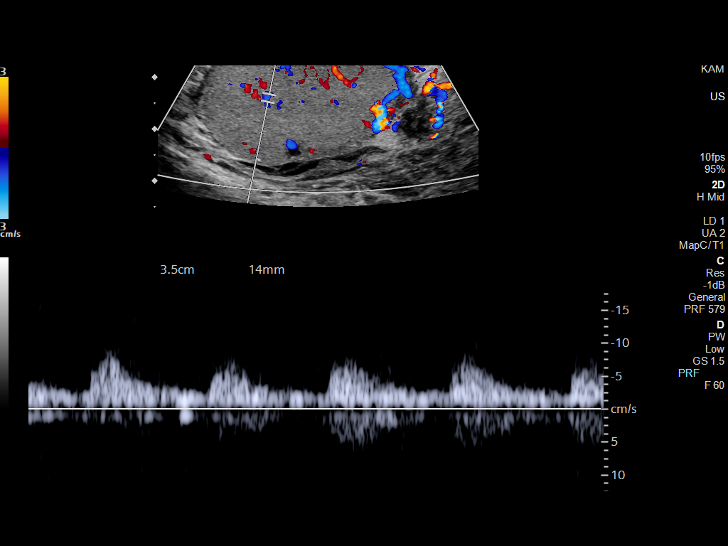
[im 106/128]
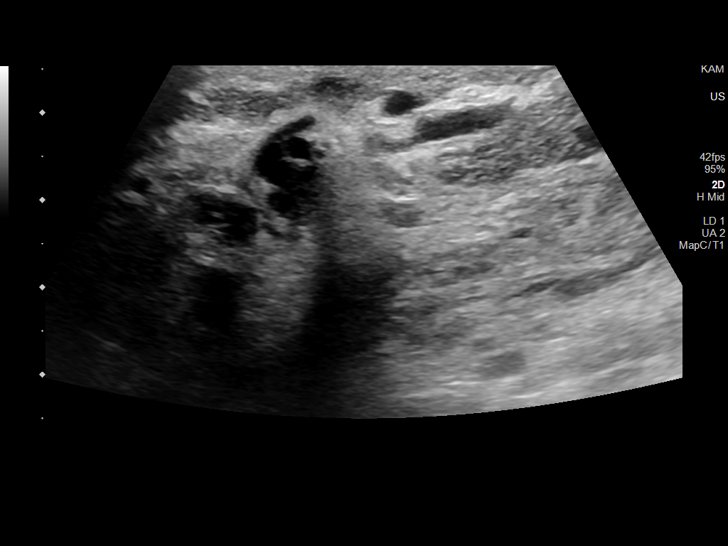
[im 117/128]
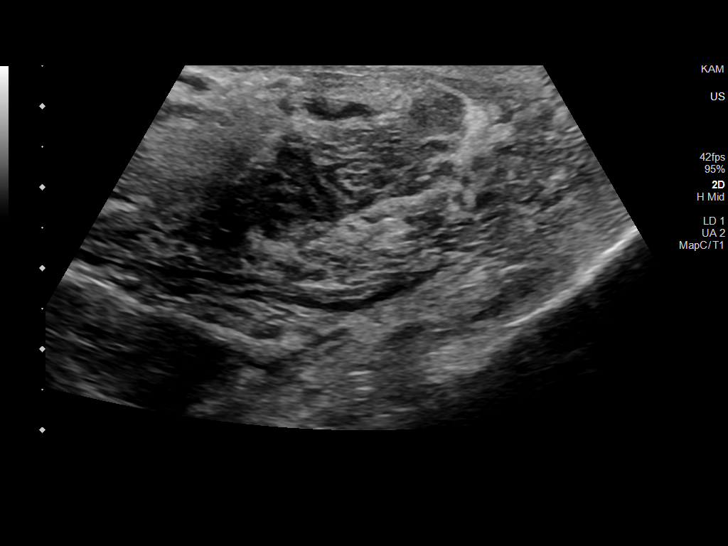
[im 128/128]
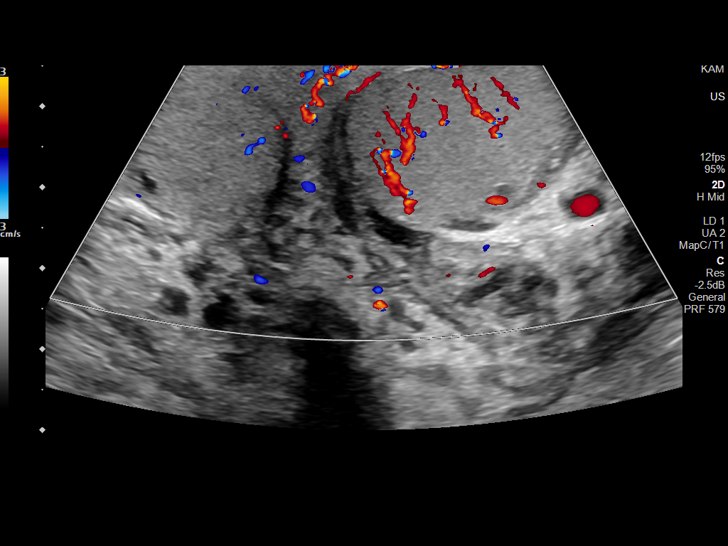

[13 of 25 positions shown; findings below may reference images not displayed]

FINDINGS: Right testicle

Measurements: 4.5 x 2.1 x 2.3 cm. A cystic area measuring 0.4 x
x 0.2 cm and the superior aspect of the testicle likely represents
tubular ectasia of the rete testis. No microlithiasis visualized.

Left testicle

Measurements: 4.4 x 2.2 x 2.8 cm. No mass or microlithiasis
visualized. The overlying scrotal skin is thickened, measuring
cm.

Right epididymis:  Normal in size and appearance.

Left epididymis:  Hyperemic and questionably enlarged.

Hydrocele:  None visualized.

Varicocele:  None visualized.

Pulsed Doppler interrogation of both testes demonstrates normal low
resistance arterial and venous waveforms bilaterally, however the
left testicle is hyperemic relative to the right.
IMPRESSION: Hyperemia of the left testicle and epididymis may represent
epididymo-orchitis.

## 2021-08-23 DIAGNOSIS — H40023 Open angle with borderline findings, high risk, bilateral: Secondary | ICD-10-CM | POA: Diagnosis not present

## 2021-11-01 DIAGNOSIS — Z1283 Encounter for screening for malignant neoplasm of skin: Secondary | ICD-10-CM | POA: Diagnosis not present

## 2021-11-01 DIAGNOSIS — D225 Melanocytic nevi of trunk: Secondary | ICD-10-CM | POA: Diagnosis not present

## 2021-11-01 DIAGNOSIS — L820 Inflamed seborrheic keratosis: Secondary | ICD-10-CM | POA: Diagnosis not present

## 2021-11-01 DIAGNOSIS — R208 Other disturbances of skin sensation: Secondary | ICD-10-CM | POA: Diagnosis not present

## 2022-01-08 ENCOUNTER — Other Ambulatory Visit: Payer: Medicare Other

## 2022-01-08 DIAGNOSIS — N138 Other obstructive and reflux uropathy: Secondary | ICD-10-CM

## 2022-01-08 DIAGNOSIS — N401 Enlarged prostate with lower urinary tract symptoms: Secondary | ICD-10-CM | POA: Diagnosis not present

## 2022-01-09 LAB — PSA: Prostate Specific Ag, Serum: 2.4 ng/mL (ref 0.0–4.0)

## 2022-01-10 ENCOUNTER — Encounter: Payer: Self-pay | Admitting: Urology

## 2022-01-10 ENCOUNTER — Ambulatory Visit (INDEPENDENT_AMBULATORY_CARE_PROVIDER_SITE_OTHER): Payer: Medicare Other | Admitting: Urology

## 2022-01-10 VITALS — BP 154/76 | HR 71 | Ht 71.0 in | Wt 175.0 lb

## 2022-01-10 DIAGNOSIS — N529 Male erectile dysfunction, unspecified: Secondary | ICD-10-CM | POA: Diagnosis not present

## 2022-01-10 DIAGNOSIS — N138 Other obstructive and reflux uropathy: Secondary | ICD-10-CM

## 2022-01-10 DIAGNOSIS — N401 Enlarged prostate with lower urinary tract symptoms: Secondary | ICD-10-CM

## 2022-01-10 DIAGNOSIS — Z125 Encounter for screening for malignant neoplasm of prostate: Secondary | ICD-10-CM | POA: Diagnosis not present

## 2022-01-10 LAB — BLADDER SCAN AMB NON-IMAGING

## 2022-01-10 MED ORDER — TADALAFIL 5 MG PO TABS: 5.0000 mg | ORAL_TABLET | Freq: Every day | ORAL | 3 refills | Status: AC | PRN

## 2022-01-10 NOTE — Patient Instructions (Signed)
[email or web address]  Sign up for a free account and we will send your presciption there

## 2022-01-10 NOTE — Progress Notes (Signed)
   01/10/2022 11:27 AM   Alexander Salas [birth year] [id number]  Reason for visit: Follow up BPH status post HOLEP, ED, PSA screening  HPI: 71 year old male who underwent a HOLEP in April 2022 for Foley dependent urinary retention with removal of 125 g of benign tissue.  He ended up with epididymoorchitis about 3 weeks after surgery that resolved with Levaquin.  He continues to urinate with a strong stream and denies any incontinence, PVR is normal today at 63m.    He has a history of significantly elevated PSA ranging from 7-14 preoperatively, and also underwent 2 negative biopsies prior to HOLEP.  Repeat PSA in November 2022 had normalized to 3.5, which may be slightly higher than expected based on his epididymoorchitis postop.  Recent PSA on 01/08/2022 was 2.4.  We reviewed the AUA guidelines that do not recommend routine screening in men over age 71 and he is comfortable discontinuing PSA screening at this time.  At our last visit we changed from sildenafil to tadalafil 5 mg on demand which has significantly improved his ED without bothersome side effects.  Cialis refilled 5 mg as needed, sent to [email or web address] Can follow-up with PCP for Cialis refills, follow-up with urology as needed   [place] [place] [phone]

## 2022-01-11 ENCOUNTER — Ambulatory Visit: Payer: Medicare Other | Admitting: Urology

## 2022-02-15 DIAGNOSIS — H40023 Open angle with borderline findings, high risk, bilateral: Secondary | ICD-10-CM | POA: Diagnosis not present

## 2022-08-22 DIAGNOSIS — I10 Essential (primary) hypertension: Secondary | ICD-10-CM | POA: Diagnosis not present

## 2022-08-22 DIAGNOSIS — H40023 Open angle with borderline findings, high risk, bilateral: Secondary | ICD-10-CM | POA: Diagnosis not present

## 2022-08-22 DIAGNOSIS — H2513 Age-related nuclear cataract, bilateral: Secondary | ICD-10-CM | POA: Diagnosis not present

## 2022-08-23 DIAGNOSIS — D120 Benign neoplasm of cecum: Secondary | ICD-10-CM | POA: Diagnosis not present

## 2022-08-23 DIAGNOSIS — Z09 Encounter for follow-up examination after completed treatment for conditions other than malignant neoplasm: Secondary | ICD-10-CM | POA: Diagnosis not present

## 2022-08-23 DIAGNOSIS — Z86010 Personal history of colonic polyps: Secondary | ICD-10-CM | POA: Diagnosis not present

## 2022-08-23 DIAGNOSIS — D124 Benign neoplasm of descending colon: Secondary | ICD-10-CM | POA: Diagnosis not present

## 2022-08-23 DIAGNOSIS — K5730 Diverticulosis of large intestine without perforation or abscess without bleeding: Secondary | ICD-10-CM | POA: Diagnosis not present

## 2022-08-23 DIAGNOSIS — K648 Other hemorrhoids: Secondary | ICD-10-CM | POA: Diagnosis not present

## 2022-11-23 DIAGNOSIS — Z79899 Other long term (current) drug therapy: Secondary | ICD-10-CM | POA: Diagnosis not present

## 2022-11-23 DIAGNOSIS — I447 Left bundle-branch block, unspecified: Secondary | ICD-10-CM | POA: Diagnosis not present

## 2022-11-23 DIAGNOSIS — Z0000 Encounter for general adult medical examination without abnormal findings: Secondary | ICD-10-CM | POA: Diagnosis not present

## 2022-11-23 DIAGNOSIS — Z23 Encounter for immunization: Secondary | ICD-10-CM | POA: Diagnosis not present

## 2022-11-23 DIAGNOSIS — I10 Essential (primary) hypertension: Secondary | ICD-10-CM | POA: Diagnosis not present

## 2022-12-11 DIAGNOSIS — Z23 Encounter for immunization: Secondary | ICD-10-CM | POA: Diagnosis not present

## 2023-01-27 NOTE — Progress Notes (Unsigned)
Cardiology Office Note:    Date:  01/29/2023   ID:  Dorise Hiss, DOB [birth year], MRN [id number]  PCP:  [facility] [place]  Cardiologist:  None  Electrophysiologist:  None   Referring MD: Darrow Bussing, MD   Chief Complaint  Patient presents with   Abnormal ECG    History of Present Illness:    Alexander Salas is a 72 y.o. male with a hx of hypertension who is referred by Dr. Docia Chuck for evaluation of left bundle branch block.  Denies any chest pain, dyspnea, lightheadedness, syncope, lower extremity edema, or palpitations.  BP 110-120s at home.  He exercises regularly, will run/walk 5 times per week for 30 to 45 minutes.  Never smoked.  Family history includes father died during CABG at age 85 and mother had CHF.    Past Medical History:  Diagnosis Date   Complication of anesthesia    Enlarged prostate    PONV (postoperative nausea and vomiting)    after 1 hernia surgery    Past Surgical History:  Procedure Laterality Date   COLONOSCOPY     HERNIA REPAIR  [id number], 2015   HOLEP-LASER ENUCLEATION OF THE PROSTATE WITH MORCELLATION N/A 06/17/2020   Procedure: HOLEP-LASER ENUCLEATION OF THE PROSTATE WITH MORCELLATION;  Surgeon: Sondra Come, MD;  Location: [facility] ORS;  Service: Urology;  Laterality: N/A;   TONSILLECTOMY     age 88    Current Medications: Current Meds  Medication Sig   amLODipine (NORVASC) 5 MG tablet Take 5 mg by mouth daily.   Carboxymethylcellul-Glycerin (LUBRICATING EYE DROPS OP) Place 1 drop into both eyes daily.   Fish Oil-Cholecalciferol (FISH OIL + D3 PO) Take 4 capsules by mouth daily.   Multiple Vitamin (MULTIVITAMIN WITH MINERALS) TABS tablet Take 1 tablet by mouth daily.   tadalafil (CIALIS) 5 MG tablet Take 1-4 tablets (5-20 mg total) by mouth daily as needed for erectile dysfunction.     Allergies:   Sulfa antibiotics   Social History   Socioeconomic History   Marital status: Married    Spouse name: Not on file    Number of children: Not on file   Years of education: Not on file   Highest education level: Not on file  Occupational History   Not on file  Tobacco Use   Smoking status: Never   Smokeless tobacco: Never  Vaping Use   Vaping status: Never Used  Substance and Sexual Activity   Alcohol use: Yes    Comment: Occasionally    Drug use: Never   Sexual activity: Yes    Birth control/protection: None  Other Topics Concern   Not on file  Social History Narrative   Not on file   Social Determinants of Health   Financial Resource Strain: Not on file  Food Insecurity: Not on file  Transportation Needs: Not on file  Physical Activity: Not on file  Stress: Not on file  Social Connections: Not on file     Family History: Family history includes father died during CABG at age 51 and mother had CHF.   ROS:   Please see the history of present illness.     All other systems reviewed and are negative.  EKGs/Labs/Other Studies Reviewed:    The following studies were reviewed today:   EKG:   01/29/23: Normal sinus rhythm, rate 69, left bundle branch block  Recent Labs: No results found for requested labs within last 365 days.  Recent Lipid Panel No results found  for: "CHOL", "TRIG", "HDL", "CHOLHDL", "VLDL", "LDLCALC", "LDLDIRECT"  Physical Exam:    VS:  BP 132/74 (BP Location: Left Arm, Patient Position: Sitting, Cuff Size: Normal)   Pulse 69   Ht 5\' 11"  (1.803 m)   Wt 177 lb (80.3 kg)   SpO2 100%   BMI 24.69 kg/m     Wt Readings from Last 3 Encounters:  01/29/23 177 lb (80.3 kg)  01/10/22 175 lb (79.4 kg)  01/12/21 175 lb (79.4 kg)     GEN:  Well nourished, well developed in no acute distress HEENT: Normal NECK: No JVD; No carotid bruits LYMPHATICS: No lymphadenopathy CARDIAC: RRR, no murmurs, rubs, gallops RESPIRATORY:  Clear to auscultation without rales, wheezing or rhonchi  ABDOMEN: Soft, non-tender, non-distended MUSCULOSKELETAL:  No edema; No deformity   SKIN: Warm and dry NEUROLOGIC:  Alert and oriented x 3 PSYCHIATRIC:  Normal affect   ASSESSMENT:    1. LBBB (left bundle branch block)   2. Abnormal electrocardiogram   3. Hyperlipidemia, unspecified hyperlipidemia type   4. Essential hypertension    PLAN:    Left bundle branch block: Recommend echocardiogram to evaluate for structural heart disease  Hypertension: On amlodipine 5 mg daily.  Appears controlled  Hyperlipidemia: LDL 106 on 11/23/2022.  Check calcium score to guide how aggressive to be in lowering cholesterol  RTC in 6 months   Medication Adjustments/Labs and Tests Ordered: Current medicines are reviewed at length with the patient today.  Concerns regarding medicines are outlined above.  Orders Placed This Encounter  Procedures   CT CARDIAC SCORING (SELF PAY ONLY)   EKG 12-Lead   ECHOCARDIOGRAM COMPLETE   No orders of the defined types were placed in this encounter.   Patient Instructions  Medication Instructions:  Continue all current medications *If you need a refill on your cardiac medications before your next appointment, please call your pharmacy*   Lab Work: none If you have labs (blood work) drawn today and your tests are completely normal, you will receive your results only by: MyChart Message (if you have MyChart) OR A paper copy in the mail If you have any lab test that is abnormal or we need to change your treatment, we will call you to review the results.   Testing/Procedures: Echo Your physician has requested that you have an echocardiogram. Echocardiography is a painless test that uses sound waves to create images of your heart. It provides your doctor with information about the size and shape of your heart and how well your heart's chambers and valves are working. This procedure takes approximately one hour. There are no restrictions for this procedure. Please do NOT wear cologne, perfume, aftershave, or lotions (deodorant is  allowed). Please arrive 15 minutes prior to your appointment time.  Please note: We ask at that you not bring children with you during ultrasound (echo/ vascular) testing. Due to room size and safety concerns, children are not allowed in the ultrasound rooms during exams. Our front office staff cannot provide observation of children in our lobby area while testing is being conducted. An adult accompanying a patient to their appointment will only be allowed in the ultrasound room at the discretion of the ultrasound technician under special circumstances. We apologize for any inconvenience.      Follow-Up: At [facility], you and your health needs are our priority.  As part of our continuing mission to provide you with exceptional heart care, we have created designated Provider Care Teams.  These Care Teams  include your primary Cardiologist (physician) and Advanced Practice Providers (APPs -  Physician Assistants and Nurse Practitioners) who all work together to provide you with the care you need, when you need it.  We recommend signing up for the patient portal called "MyChart".  Sign up information is provided on this After Visit Summary.  MyChart is used to connect with patients for Virtual Visits (Telemedicine).  Patients are able to view lab/test results, encounter notes, upcoming appointments, etc.  Non-urgent messages can be sent to your provider as well.   To learn more about what you can do with MyChart, go to [email or web address].    Your next appointment:   6 month(s)  Provider:   Dr.Hodaya Curto  Other Instructions Calcium score  this is self pay test. The cost is 100     Signed, Little Ishikawa, MD  01/29/2023 9:52 AM    [facility]

## 2023-01-29 ENCOUNTER — Ambulatory Visit: Payer: Medicare Other | Attending: Cardiology | Admitting: Cardiology

## 2023-01-29 VITALS — BP 132/74 | HR 69 | Ht 71.0 in | Wt 177.0 lb

## 2023-01-29 DIAGNOSIS — E785 Hyperlipidemia, unspecified: Secondary | ICD-10-CM | POA: Insufficient documentation

## 2023-01-29 DIAGNOSIS — I447 Left bundle-branch block, unspecified: Secondary | ICD-10-CM | POA: Diagnosis not present

## 2023-01-29 DIAGNOSIS — I10 Essential (primary) hypertension: Secondary | ICD-10-CM | POA: Insufficient documentation

## 2023-01-29 DIAGNOSIS — R9431 Abnormal electrocardiogram [ECG] [EKG]: Secondary | ICD-10-CM | POA: Insufficient documentation

## 2023-01-29 NOTE — Patient Instructions (Addendum)
Medication Instructions:  Continue all current medications *If you need a refill on your cardiac medications before your next appointment, please call your pharmacy*   Lab Work: none If you have labs (blood work) drawn today and your tests are completely normal, you will receive your results only by: MyChart Message (if you have MyChart) OR A paper copy in the mail If you have any lab test that is abnormal or we need to change your treatment, we will call you to review the results.   Testing/Procedures: Echo Your physician has requested that you have an echocardiogram. Echocardiography is a painless test that uses sound waves to create images of your heart. It provides your doctor with information about the size and shape of your heart and how well your heart's chambers and valves are working. This procedure takes approximately one hour. There are no restrictions for this procedure. Please do NOT wear cologne, perfume, aftershave, or lotions (deodorant is allowed). Please arrive 15 minutes prior to your appointment time.  Please note: We ask at that you not bring children with you during ultrasound (echo/ vascular) testing. Due to room size and safety concerns, children are not allowed in the ultrasound rooms during exams. Our front office staff cannot provide observation of children in our lobby area while testing is being conducted. An adult accompanying a patient to their appointment will only be allowed in the ultrasound room at the discretion of the ultrasound technician under special circumstances. We apologize for any inconvenience.      Follow-Up: At [facility], you and your health needs are our priority.  As part of our continuing mission to provide you with exceptional heart care, we have created designated Provider Care Teams.  These Care Teams include your primary Cardiologist (physician) and Advanced Practice Providers (APPs -  Physician Assistants and Nurse  Practitioners) who all work together to provide you with the care you need, when you need it.  We recommend signing up for the patient portal called "MyChart".  Sign up information is provided on this After Visit Summary.  MyChart is used to connect with patients for Virtual Visits (Telemedicine).  Patients are able to view lab/test results, encounter notes, upcoming appointments, etc.  Non-urgent messages can be sent to your provider as well.   To learn more about what you can do with MyChart, go to [email or web address].    Your next appointment:   6 month(s)  Provider:   Dr.Schumann  Other Instructions Calcium score  this is self pay test. The cost is 100

## 2023-01-31 DIAGNOSIS — H40023 Open angle with borderline findings, high risk, bilateral: Secondary | ICD-10-CM | POA: Diagnosis not present

## 2023-02-07 DIAGNOSIS — H40023 Open angle with borderline findings, high risk, bilateral: Secondary | ICD-10-CM | POA: Diagnosis not present

## 2023-03-06 ENCOUNTER — Ambulatory Visit (HOSPITAL_BASED_OUTPATIENT_CLINIC_OR_DEPARTMENT_OTHER)
Admission: RE | Admit: 2023-03-06 | Discharge: 2023-03-06 | Disposition: A | Discharge status: Home / Self Care | Payer: Self-pay | Source: Ambulatory Visit | Attending: Cardiology | Admitting: Cardiology

## 2023-03-06 ENCOUNTER — Ambulatory Visit (INDEPENDENT_AMBULATORY_CARE_PROVIDER_SITE_OTHER): Payer: Medicare Other

## 2023-03-06 DIAGNOSIS — R9431 Abnormal electrocardiogram [ECG] [EKG]: Secondary | ICD-10-CM | POA: Diagnosis not present

## 2023-03-06 DIAGNOSIS — I447 Left bundle-branch block, unspecified: Secondary | ICD-10-CM

## 2023-03-06 DIAGNOSIS — E785 Hyperlipidemia, unspecified: Secondary | ICD-10-CM | POA: Insufficient documentation

## 2023-03-06 LAB — ECHOCARDIOGRAM COMPLETE
Area-P 1/2: 3.37 cm2
S' Lateral: 2.91 cm

## 2023-03-07 ENCOUNTER — Other Ambulatory Visit: Payer: Self-pay | Admitting: *Deleted

## 2023-03-07 ENCOUNTER — Telehealth: Payer: Self-pay | Admitting: *Deleted

## 2023-03-07 DIAGNOSIS — E785 Hyperlipidemia, unspecified: Secondary | ICD-10-CM

## 2023-03-07 MED ORDER — ATORVASTATIN CALCIUM 20 MG PO TABS: 20.0000 mg | ORAL_TABLET | Freq: Every day | ORAL | 3 refills | Status: DC

## 2023-03-07 NOTE — Telephone Encounter (Signed)
 Called and made patient aware that per Dr. Bjorn Pippin no significant abnormality seen on echo. Patient verbalized an understanding.

## 2023-03-07 NOTE — Telephone Encounter (Signed)
 Called patient and made aware of Dr. Bjorn Pippin recommendations. Atorvastatin 20 mg daily and Lipid Panel in 2 months. Patient verbalized an understanding.

## 2023-03-27 DIAGNOSIS — H903 Sensorineural hearing loss, bilateral: Secondary | ICD-10-CM | POA: Diagnosis not present

## 2023-03-27 DIAGNOSIS — Z570 Occupational exposure to noise: Secondary | ICD-10-CM | POA: Diagnosis not present

## 2023-05-06 DIAGNOSIS — E785 Hyperlipidemia, unspecified: Secondary | ICD-10-CM | POA: Diagnosis not present

## 2023-05-06 LAB — LIPID PANEL
Chol/HDL Ratio: 2 ratio (ref 0.0–5.0)
Cholesterol, Total: 132 mg/dL (ref 100–199)
HDL: 66 mg/dL (ref >39)
LDL Chol Calc (NIH): 53 mg/dL (ref 0–99)
Triglycerides: 61 mg/dL (ref 0–149)
VLDL Cholesterol Cal: 13 mg/dL (ref 5–40)

## 2023-05-09 ENCOUNTER — Telehealth: Payer: Self-pay | Admitting: *Deleted

## 2023-05-09 NOTE — Telephone Encounter (Signed)
 Spoke with patient, results given and patient has no questions. Let patient know that we were moving June 24, 2023 and told him that I would mail him the new address so that he would know where to come his next appointment.

## 2023-08-22 DIAGNOSIS — H2513 Age-related nuclear cataract, bilateral: Secondary | ICD-10-CM | POA: Diagnosis not present

## 2023-08-22 DIAGNOSIS — H40023 Open angle with borderline findings, high risk, bilateral: Secondary | ICD-10-CM | POA: Diagnosis not present

## 2023-09-02 NOTE — Progress Notes (Signed)
 Cardiology Office Note:    Date:  09/06/2023   ID:  Alexander Salas, DOB 04-Aug-1950, MRN [id number]  PCP:  [facility] [place]  Cardiologist:  Lonni LITTIE Nanas, MD  Electrophysiologist:  None   Referring MD: [facility]   No chief complaint on file.   History of Present Illness:    Alexander Salas is a 73 y.o. male with a hx of hypertension who presents for follow-up.  He was referred by Dr. Regino for evaluation of left bundle branch block, initially seen 01/29/2023.    Echocardiogram on 03/06/23 showed normal biventricular function, moderate left atrial enlargement, no significant valvular disease.  Calcium  score 03/06/2023 was 56 (31st percentile).  Since last clinic visit, he reports he is doing well.  Denies any chest pain, dyspnea, lightheadedness, syncope, lower extremity edema, or palpitations.  He continues to exercise regularly, he will run/walk and lift weights 3 days/week.  Past Medical History:  Diagnosis Date   Complication of anesthesia    Enlarged prostate    PONV (postoperative nausea and vomiting)    after 1 hernia surgery    Past Surgical History:  Procedure Laterality Date   COLONOSCOPY     HERNIA REPAIR  [id number], 2015   HOLEP-LASER ENUCLEATION OF THE PROSTATE WITH MORCELLATION N/A 06/17/2020   Procedure: HOLEP-LASER ENUCLEATION OF THE PROSTATE WITH MORCELLATION;  Surgeon: Francisca Redell BROCKS, MD;  Location: [facility] ORS;  Service: Urology;  Laterality: N/A;   TONSILLECTOMY     age 57    Current Medications: Current Meds  Medication Sig   amLODipine (NORVASC) 5 MG tablet Take 5 mg by mouth daily.   atorvastatin  (LIPITOR) 20 MG tablet Take 1 tablet (20 mg total) by mouth daily.   Carboxymethylcellul-Glycerin (LUBRICATING EYE DROPS OP) Place 1 drop into both eyes daily.   Fish Oil-Cholecalciferol (FISH OIL + D3 PO) Take 4 capsules by mouth daily.   Multiple Vitamin (MULTIVITAMIN WITH MINERALS) TABS tablet Take 1 tablet by  mouth daily.   tadalafil  (CIALIS ) 5 MG tablet Take 1-4 tablets (5-20 mg total) by mouth daily as needed for erectile dysfunction.     Allergies:   Sulfa antibiotics   Social History   Socioeconomic History   Marital status: Married    Spouse name: Not on file   Number of children: Not on file   Years of education: Not on file   Highest education level: Not on file  Occupational History   Not on file  Tobacco Use   Smoking status: Never   Smokeless tobacco: Never  Vaping Use   Vaping status: Never Used  Substance and Sexual Activity   Alcohol use: Yes    Comment: Occasionally    Drug use: Never   Sexual activity: Yes    Birth control/protection: None  Other Topics Concern   Not on file  Social History Narrative   Not on file   Social Drivers of Health   Financial Resource Strain: Not on file  Food Insecurity: Not on file  Transportation Needs: Not on file  Physical Activity: Not on file  Stress: Not on file  Social Connections: Not on file     Family History: Family history includes father died during CABG at age 55 and mother had CHF.   ROS:   Please see the history of present illness.     All other systems reviewed and are negative.  EKGs/Labs/Other Studies Reviewed:    The following studies were reviewed today:  EKG:   01/29/23: Normal sinus rhythm, rate 69, left bundle branch block 09/06/2023: Normal sinus rhythm, left bundle branch block, rate 67  Recent Labs: No results found for requested labs within last 365 days.  Recent Lipid Panel    Component Value Date/Time   CHOL 132 05/06/2023 0848   TRIG 61 05/06/2023 0848   HDL 66 05/06/2023 0848   CHOLHDL 2.0 05/06/2023 0848   LDLCALC 53 05/06/2023 0848    Physical Exam:    VS:  BP 138/77 (BP Location: Left Arm, Patient Position: Sitting)   Pulse 67   Ht 5' 11 (1.803 m)   Wt 171 lb 3.2 oz (77.7 kg)   SpO2 100%   BMI 23.88 kg/m     Wt Readings from Last 3 Encounters:  09/06/23 171 lb 3.2  oz (77.7 kg)  01/29/23 177 lb (80.3 kg)  01/10/22 175 lb (79.4 kg)     GEN:  Well nourished, well developed in no acute distress HEENT: Normal NECK: No JVD; No carotid bruits LYMPHATICS: No lymphadenopathy CARDIAC: RRR, no murmurs, rubs, gallops RESPIRATORY:  Clear to auscultation without rales, wheezing or rhonchi  ABDOMEN: Soft, non-tender, non-distended MUSCULOSKELETAL:  No edema; No deformity  SKIN: Warm and dry NEUROLOGIC:  Alert and oriented x 3 PSYCHIATRIC:  Normal affect   ASSESSMENT:    1. LBBB (left bundle branch block)   2. Essential hypertension   3. Hyperlipidemia, unspecified hyperlipidemia type     PLAN:    Left bundle branch block: Echocardiogram on 03/06/23 showed normal biventricular function, moderate left atrial enlargement, no significant valvular disease.    Hypertension: On amlodipine 5 mg daily.  Appears controlled  Hyperlipidemia: LDL 106 on 11/23/2022.  Calcium  score 03/06/2023 was 56 (31st percentile).  Started on atorvastatin  20 mg daily, LDL 53 on 05/06/2023  RTC in 1 year   Medication Adjustments/Labs and Tests Ordered: Current medicines are reviewed at length with the patient today.  Concerns regarding medicines are outlined above.  Orders Placed This Encounter  Procedures   EKG 12-Lead   No orders of the defined types were placed in this encounter.   Patient Instructions  Medication Instructions:  Your physician recommends that you continue on your current medications as directed. Please refer to the Current Medication list given to you today.  *If you need a refill on your cardiac medications before your next appointment, please call your pharmacy*  Follow-Up: At [facility], you and your health needs are our priority.  As part of our continuing mission to provide you with exceptional heart care, our providers are all part of one team.  This team includes your primary Cardiologist (physician) and Advanced Practice Providers or  APPs (Physician Assistants and Nurse Practitioners) who all work together to provide you with the care you need, when you need it.  Your next appointment:   1 year(s)  Provider:   Lonni LITTIE Nanas, MD   We recommend signing up for the patient portal called MyChart.  Sign up information is provided on this After Visit Summary.  MyChart is used to connect with patients for Virtual Visits (Telemedicine).  Patients are able to view lab/test results, encounter notes, upcoming appointments, etc.  Non-urgent messages can be sent to your provider as well.   To learn more about what you can do with MyChart, go to [email or web address].    Signed, Lonni LITTIE Nanas, MD  09/06/2023 10:52 AM    [facility]

## 2023-09-06 ENCOUNTER — Encounter: Payer: Self-pay | Admitting: Cardiology

## 2023-09-06 ENCOUNTER — Ambulatory Visit: Attending: Cardiology | Admitting: Cardiology

## 2023-09-06 VITALS — BP 138/77 | HR 67 | Ht 71.0 in | Wt 171.2 lb

## 2023-09-06 DIAGNOSIS — I10 Essential (primary) hypertension: Secondary | ICD-10-CM | POA: Diagnosis not present

## 2023-09-06 DIAGNOSIS — E785 Hyperlipidemia, unspecified: Secondary | ICD-10-CM | POA: Insufficient documentation

## 2023-09-06 DIAGNOSIS — I447 Left bundle-branch block, unspecified: Secondary | ICD-10-CM | POA: Insufficient documentation

## 2023-09-06 NOTE — Patient Instructions (Signed)
 Medication Instructions:  Your physician recommends that you continue on your current medications as directed. Please refer to the Current Medication list given to you today.  *If you need a refill on your cardiac medications before your next appointment, please call your pharmacy*  Follow-Up: At [facility], you and your health needs are our priority.  As part of our continuing mission to provide you with exceptional heart care, our providers are all part of one team.  This team includes your primary Cardiologist (physician) and Advanced Practice Providers or APPs (Physician Assistants and Nurse Practitioners) who all work together to provide you with the care you need, when you need it.  Your next appointment:   1 year(s)  Provider:   Lonni LITTIE Nanas, MD   We recommend signing up for the patient portal called MyChart.  Sign up information is provided on this After Visit Summary.  MyChart is used to connect with patients for Virtual Visits (Telemedicine).  Patients are able to view lab/test results, encounter notes, upcoming appointments, etc.  Non-urgent messages can be sent to your provider as well.   To learn more about what you can do with MyChart, go to [email or web address].

## 2023-11-22 ENCOUNTER — Other Ambulatory Visit: Payer: Self-pay | Admitting: Cardiology

## 2023-11-22 DIAGNOSIS — E785 Hyperlipidemia, unspecified: Secondary | ICD-10-CM

## 2023-12-02 DIAGNOSIS — I447 Left bundle-branch block, unspecified: Secondary | ICD-10-CM | POA: Diagnosis not present

## 2023-12-02 DIAGNOSIS — Z0000 Encounter for general adult medical examination without abnormal findings: Secondary | ICD-10-CM | POA: Diagnosis not present

## 2023-12-02 DIAGNOSIS — I10 Essential (primary) hypertension: Secondary | ICD-10-CM | POA: Diagnosis not present

## 2023-12-02 DIAGNOSIS — E7800 Pure hypercholesterolemia, unspecified: Secondary | ICD-10-CM | POA: Diagnosis not present

## 2023-12-02 DIAGNOSIS — Z1331 Encounter for screening for depression: Secondary | ICD-10-CM | POA: Diagnosis not present

## 2023-12-02 DIAGNOSIS — Z23 Encounter for immunization: Secondary | ICD-10-CM | POA: Diagnosis not present

## 2023-12-02 DIAGNOSIS — R7301 Impaired fasting glucose: Secondary | ICD-10-CM | POA: Diagnosis not present

## 2023-12-02 DIAGNOSIS — Z79899 Other long term (current) drug therapy: Secondary | ICD-10-CM | POA: Diagnosis not present
